# Patient Record
Sex: Male | Born: 1970
Health system: Southern US, Community
[De-identification: ages and names within clinical notes are randomized; demographics above are authoritative.]

## PROBLEM LIST (undated history)

## (undated) DIAGNOSIS — I10 Essential (primary) hypertension: Secondary | ICD-10-CM

## (undated) DIAGNOSIS — E785 Hyperlipidemia, unspecified: Secondary | ICD-10-CM

## (undated) DIAGNOSIS — M1990 Unspecified osteoarthritis, unspecified site: Secondary | ICD-10-CM

## (undated) HISTORY — PX: HERNIA REPAIR: SHX51

---

## 1997-11-08 ENCOUNTER — Emergency Department (HOSPITAL_COMMUNITY): Admission: EM | Admit: 1997-11-08 | Discharge: 1997-11-08 | Payer: Self-pay | Admitting: Emergency Medicine

## 1997-12-02 ENCOUNTER — Emergency Department (HOSPITAL_COMMUNITY): Admission: EM | Admit: 1997-12-02 | Discharge: 1997-12-02 | Payer: Self-pay | Admitting: Emergency Medicine

## 1997-12-23 ENCOUNTER — Encounter: Payer: Self-pay | Admitting: Emergency Medicine

## 1997-12-23 ENCOUNTER — Emergency Department (HOSPITAL_COMMUNITY): Admission: EM | Admit: 1997-12-23 | Discharge: 1997-12-23 | Payer: Self-pay | Admitting: Emergency Medicine

## 1998-07-12 ENCOUNTER — Encounter: Payer: Self-pay | Admitting: Emergency Medicine

## 1998-07-12 ENCOUNTER — Emergency Department (HOSPITAL_COMMUNITY): Admission: EM | Admit: 1998-07-12 | Discharge: 1998-07-12 | Payer: Self-pay | Admitting: Emergency Medicine

## 1998-12-02 ENCOUNTER — Emergency Department (HOSPITAL_COMMUNITY): Admission: EM | Admit: 1998-12-02 | Discharge: 1998-12-02 | Payer: Self-pay | Admitting: *Deleted

## 1999-05-31 ENCOUNTER — Emergency Department (HOSPITAL_COMMUNITY): Admission: EM | Admit: 1999-05-31 | Discharge: 1999-06-01 | Payer: Self-pay | Admitting: Emergency Medicine

## 1999-09-25 ENCOUNTER — Emergency Department (HOSPITAL_COMMUNITY): Admission: EM | Admit: 1999-09-25 | Discharge: 1999-09-25 | Payer: Self-pay | Admitting: Emergency Medicine

## 1999-10-18 ENCOUNTER — Emergency Department (HOSPITAL_COMMUNITY): Admission: EM | Admit: 1999-10-18 | Discharge: 1999-10-18 | Payer: Self-pay | Admitting: *Deleted

## 1999-10-23 ENCOUNTER — Emergency Department (HOSPITAL_COMMUNITY): Admission: EM | Admit: 1999-10-23 | Discharge: 1999-10-23 | Payer: Self-pay | Admitting: Internal Medicine

## 1999-10-23 ENCOUNTER — Encounter: Payer: Self-pay | Admitting: Internal Medicine

## 1999-11-24 ENCOUNTER — Emergency Department (HOSPITAL_COMMUNITY): Admission: EM | Admit: 1999-11-24 | Discharge: 1999-11-24 | Payer: Self-pay | Admitting: Emergency Medicine

## 2000-01-16 ENCOUNTER — Encounter: Payer: Self-pay | Admitting: Emergency Medicine

## 2000-01-16 ENCOUNTER — Emergency Department (HOSPITAL_COMMUNITY): Admission: EM | Admit: 2000-01-16 | Discharge: 2000-01-16 | Payer: Self-pay | Admitting: Emergency Medicine

## 2000-05-12 ENCOUNTER — Emergency Department (HOSPITAL_COMMUNITY): Admission: EM | Admit: 2000-05-12 | Discharge: 2000-05-12 | Payer: Self-pay | Admitting: Emergency Medicine

## 2000-05-12 ENCOUNTER — Encounter: Payer: Self-pay | Admitting: Emergency Medicine

## 2000-05-22 ENCOUNTER — Encounter: Admission: RE | Admit: 2000-05-22 | Discharge: 2000-05-22 | Payer: Self-pay

## 2000-05-24 ENCOUNTER — Ambulatory Visit (HOSPITAL_COMMUNITY): Admission: RE | Admit: 2000-05-24 | Discharge: 2000-05-24 | Payer: Self-pay

## 2000-06-12 ENCOUNTER — Emergency Department (HOSPITAL_COMMUNITY): Admission: EM | Admit: 2000-06-12 | Discharge: 2000-06-12 | Payer: Self-pay | Admitting: Emergency Medicine

## 2000-06-26 ENCOUNTER — Encounter: Payer: Self-pay | Admitting: Internal Medicine

## 2000-06-26 ENCOUNTER — Emergency Department (HOSPITAL_COMMUNITY): Admission: EM | Admit: 2000-06-26 | Discharge: 2000-06-26 | Payer: Self-pay | Admitting: Internal Medicine

## 2000-08-04 ENCOUNTER — Emergency Department (HOSPITAL_COMMUNITY): Admission: EM | Admit: 2000-08-04 | Discharge: 2000-08-04 | Payer: Self-pay | Admitting: Emergency Medicine

## 2001-02-25 ENCOUNTER — Emergency Department (HOSPITAL_COMMUNITY): Admission: EM | Admit: 2001-02-25 | Discharge: 2001-02-26 | Payer: Self-pay | Admitting: Emergency Medicine

## 2001-09-08 ENCOUNTER — Emergency Department (HOSPITAL_COMMUNITY): Admission: EM | Admit: 2001-09-08 | Discharge: 2001-09-09 | Payer: Self-pay

## 2001-10-17 ENCOUNTER — Emergency Department (HOSPITAL_COMMUNITY): Admission: EM | Admit: 2001-10-17 | Discharge: 2001-10-17 | Payer: Self-pay

## 2001-12-30 ENCOUNTER — Emergency Department (HOSPITAL_COMMUNITY): Admission: EM | Admit: 2001-12-30 | Discharge: 2001-12-30 | Payer: Self-pay | Admitting: Emergency Medicine

## 2002-04-22 ENCOUNTER — Encounter: Payer: Self-pay | Admitting: Emergency Medicine

## 2002-04-22 ENCOUNTER — Emergency Department (HOSPITAL_COMMUNITY): Admission: EM | Admit: 2002-04-22 | Discharge: 2002-04-22 | Payer: Self-pay | Admitting: Emergency Medicine

## 2002-08-06 ENCOUNTER — Encounter: Payer: Self-pay | Admitting: Emergency Medicine

## 2002-08-06 ENCOUNTER — Emergency Department (HOSPITAL_COMMUNITY): Admission: EM | Admit: 2002-08-06 | Discharge: 2002-08-06 | Payer: Self-pay | Admitting: Emergency Medicine

## 2002-08-24 ENCOUNTER — Emergency Department (HOSPITAL_COMMUNITY): Admission: EM | Admit: 2002-08-24 | Discharge: 2002-08-24 | Payer: Self-pay | Admitting: Emergency Medicine

## 2002-12-09 ENCOUNTER — Emergency Department (HOSPITAL_COMMUNITY): Admission: EM | Admit: 2002-12-09 | Discharge: 2002-12-09 | Payer: Self-pay | Admitting: Emergency Medicine

## 2003-01-28 ENCOUNTER — Emergency Department (HOSPITAL_COMMUNITY): Admission: EM | Admit: 2003-01-28 | Discharge: 2003-01-28 | Payer: Self-pay | Admitting: *Deleted

## 2003-06-01 ENCOUNTER — Emergency Department (HOSPITAL_COMMUNITY): Admission: EM | Admit: 2003-06-01 | Discharge: 2003-06-01 | Payer: Self-pay | Admitting: Emergency Medicine

## 2003-07-12 ENCOUNTER — Emergency Department (HOSPITAL_COMMUNITY): Admission: EM | Admit: 2003-07-12 | Discharge: 2003-07-12 | Payer: Self-pay | Admitting: Emergency Medicine

## 2004-01-16 ENCOUNTER — Emergency Department (HOSPITAL_COMMUNITY): Admission: EM | Admit: 2004-01-16 | Discharge: 2004-01-16 | Payer: Self-pay | Admitting: Emergency Medicine

## 2004-02-13 ENCOUNTER — Emergency Department (HOSPITAL_COMMUNITY): Admission: EM | Admit: 2004-02-13 | Discharge: 2004-02-13 | Payer: Self-pay | Admitting: Emergency Medicine

## 2004-07-02 ENCOUNTER — Emergency Department (HOSPITAL_COMMUNITY): Admission: EM | Admit: 2004-07-02 | Discharge: 2004-07-02 | Payer: Self-pay | Admitting: Emergency Medicine

## 2004-09-04 ENCOUNTER — Emergency Department (HOSPITAL_COMMUNITY): Admission: EM | Admit: 2004-09-04 | Discharge: 2004-09-05 | Payer: Self-pay | Admitting: Emergency Medicine

## 2004-12-02 ENCOUNTER — Ambulatory Visit (HOSPITAL_COMMUNITY): Admission: RE | Admit: 2004-12-02 | Discharge: 2004-12-02 | Payer: Self-pay | Admitting: Orthopaedic Surgery

## 2004-12-26 ENCOUNTER — Emergency Department (HOSPITAL_COMMUNITY): Admission: EM | Admit: 2004-12-26 | Discharge: 2004-12-27 | Payer: Self-pay | Admitting: Emergency Medicine

## 2005-04-25 ENCOUNTER — Emergency Department (HOSPITAL_COMMUNITY): Admission: EM | Admit: 2005-04-25 | Discharge: 2005-04-25 | Payer: Self-pay | Admitting: *Deleted

## 2005-07-25 ENCOUNTER — Inpatient Hospital Stay (HOSPITAL_COMMUNITY): Admission: EM | Admit: 2005-07-25 | Discharge: 2005-07-28 | Payer: Self-pay | Admitting: Emergency Medicine

## 2005-07-27 ENCOUNTER — Ambulatory Visit: Payer: Self-pay | Admitting: Cardiology

## 2005-09-03 ENCOUNTER — Emergency Department (HOSPITAL_COMMUNITY): Admission: EM | Admit: 2005-09-03 | Discharge: 2005-09-04 | Payer: Self-pay | Admitting: Emergency Medicine

## 2005-12-04 ENCOUNTER — Emergency Department (HOSPITAL_COMMUNITY): Admission: EM | Admit: 2005-12-04 | Discharge: 2005-12-04 | Payer: Self-pay | Admitting: Emergency Medicine

## 2006-01-28 ENCOUNTER — Emergency Department (HOSPITAL_COMMUNITY): Admission: EM | Admit: 2006-01-28 | Discharge: 2006-01-28 | Payer: Self-pay | Admitting: Emergency Medicine

## 2006-04-13 ENCOUNTER — Inpatient Hospital Stay (HOSPITAL_COMMUNITY): Admission: EM | Admit: 2006-04-13 | Discharge: 2006-04-17 | Payer: Self-pay | Admitting: Emergency Medicine

## 2006-04-13 ENCOUNTER — Emergency Department (HOSPITAL_COMMUNITY): Admission: EM | Admit: 2006-04-13 | Discharge: 2006-04-13 | Payer: Self-pay | Admitting: Emergency Medicine

## 2006-04-26 ENCOUNTER — Emergency Department (HOSPITAL_COMMUNITY): Admission: EM | Admit: 2006-04-26 | Discharge: 2006-04-26 | Payer: Self-pay | Admitting: Emergency Medicine

## 2006-05-28 ENCOUNTER — Emergency Department (HOSPITAL_COMMUNITY): Admission: EM | Admit: 2006-05-28 | Discharge: 2006-05-28 | Payer: Self-pay | Admitting: Family Medicine

## 2006-06-27 ENCOUNTER — Emergency Department (HOSPITAL_COMMUNITY): Admission: EM | Admit: 2006-06-27 | Discharge: 2006-06-27 | Payer: Self-pay | Admitting: Emergency Medicine

## 2006-07-10 ENCOUNTER — Emergency Department (HOSPITAL_COMMUNITY): Admission: EM | Admit: 2006-07-10 | Discharge: 2006-07-10 | Payer: Self-pay | Admitting: Family Medicine

## 2006-08-27 ENCOUNTER — Emergency Department (HOSPITAL_COMMUNITY): Admission: EM | Admit: 2006-08-27 | Discharge: 2006-08-27 | Payer: Self-pay | Admitting: Emergency Medicine

## 2006-11-09 ENCOUNTER — Emergency Department (HOSPITAL_COMMUNITY): Admission: EM | Admit: 2006-11-09 | Discharge: 2006-11-09 | Payer: Self-pay | Admitting: Emergency Medicine

## 2006-12-14 ENCOUNTER — Emergency Department (HOSPITAL_COMMUNITY): Admission: EM | Admit: 2006-12-14 | Discharge: 2006-12-15 | Payer: Self-pay | Admitting: Emergency Medicine

## 2006-12-17 ENCOUNTER — Emergency Department (HOSPITAL_COMMUNITY): Admission: EM | Admit: 2006-12-17 | Discharge: 2006-12-17 | Payer: Self-pay | Admitting: Emergency Medicine

## 2007-01-13 ENCOUNTER — Emergency Department (HOSPITAL_COMMUNITY): Admission: EM | Admit: 2007-01-13 | Discharge: 2007-01-13 | Payer: Self-pay | Admitting: Emergency Medicine

## 2007-05-20 IMAGING — US US ABDOMEN PORT
1 series · 14 of 25 positions shown · non-contrast
Comparison: none

CLINICAL DATA: Elevated LFTs

[Series 1: unknown · 0.43mm/px · 14 of 58 slices shown]
[im 1/58]
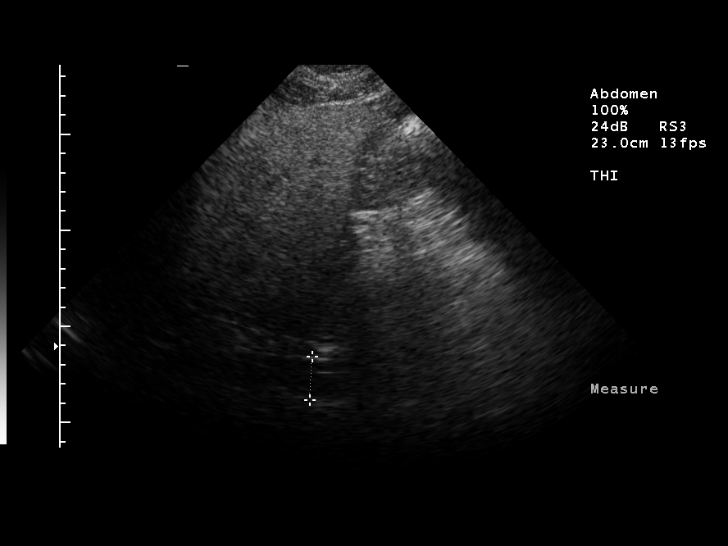
[im 5/58]
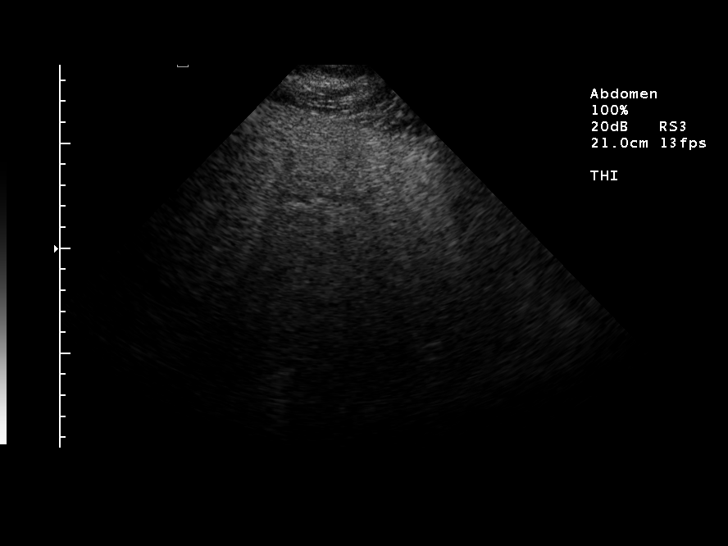
[im 10/58]
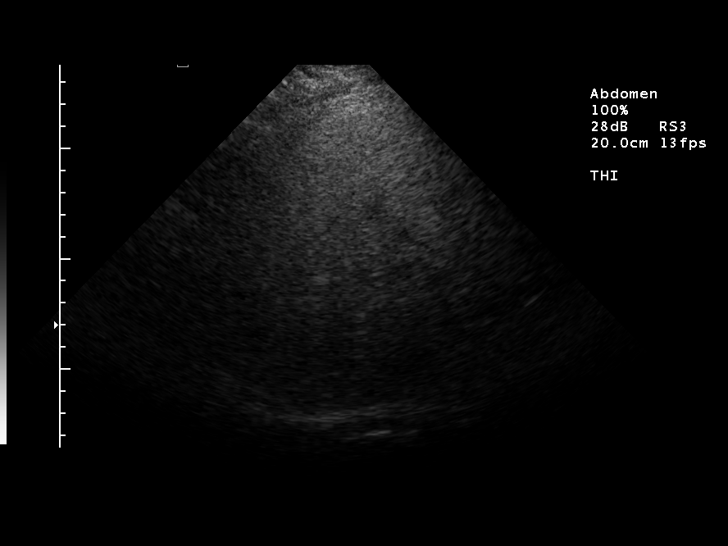
[im 15/58]
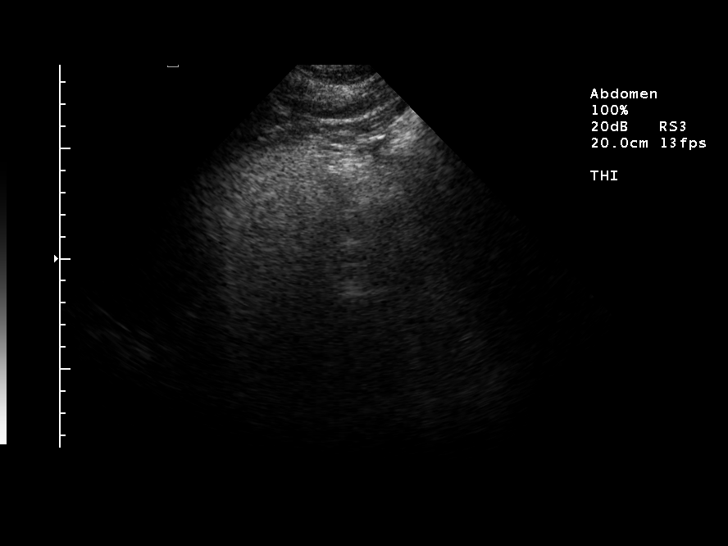
[im 20/58]
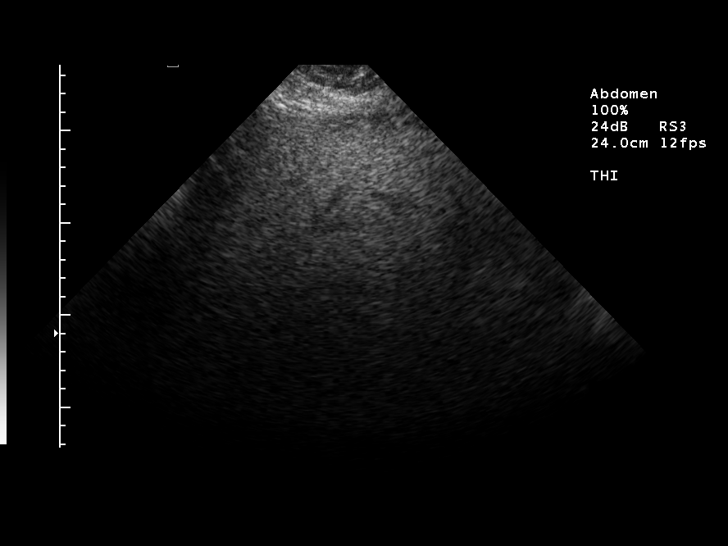
[im 22/58]
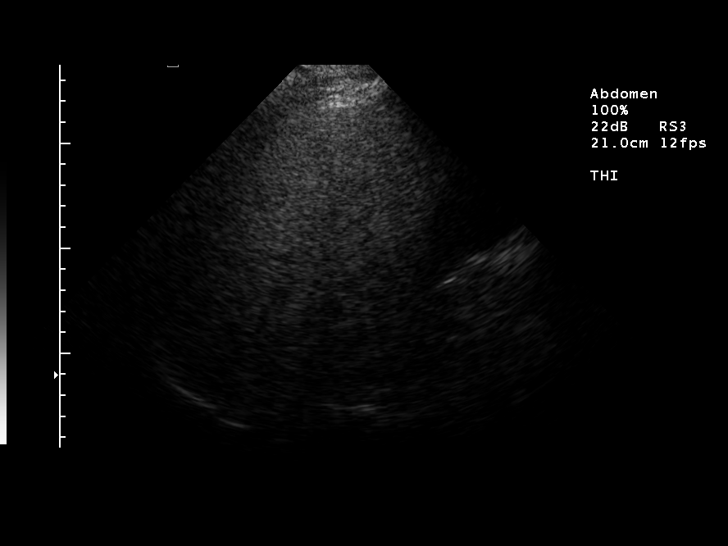
[im 27/58]
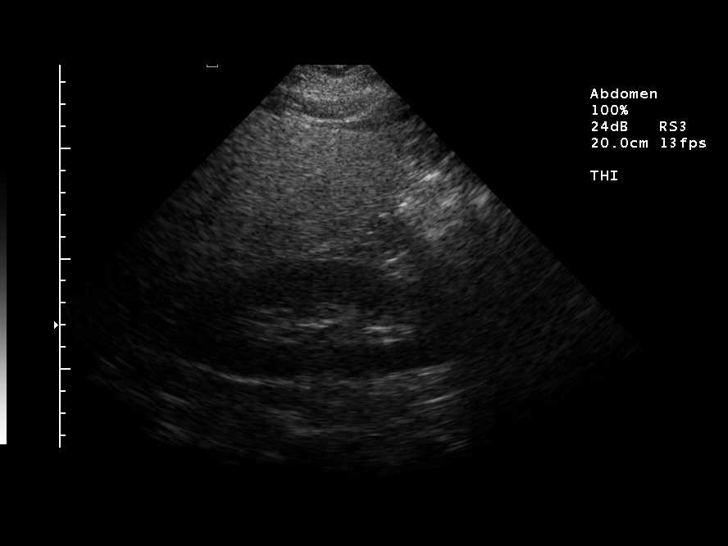
[im 31/58]
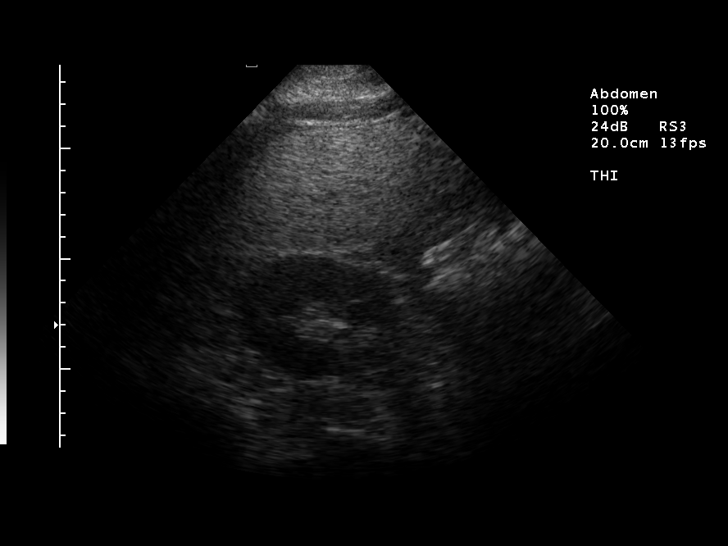
[im 36/58]
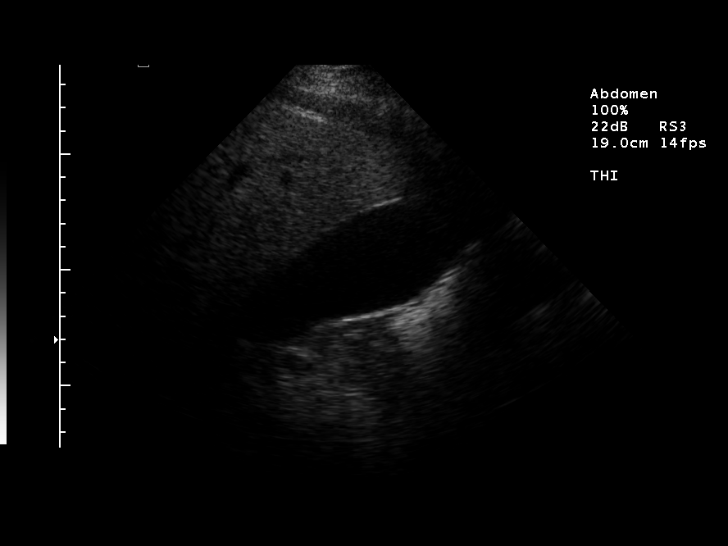
[im 39/58]
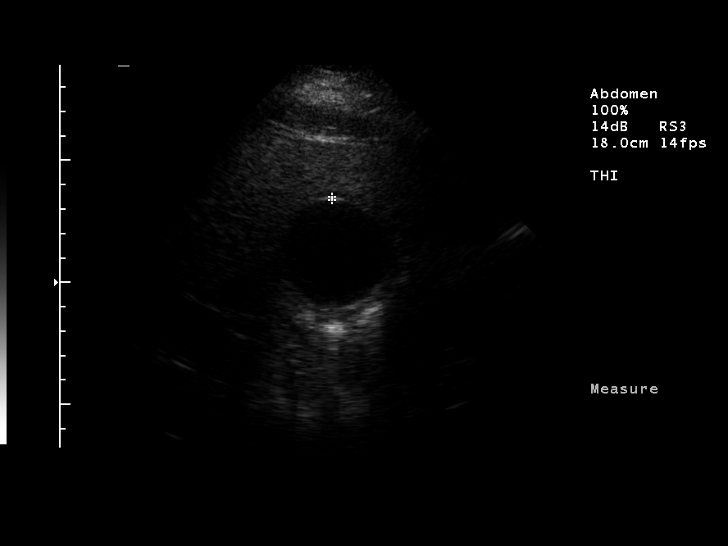
[im 43/58]
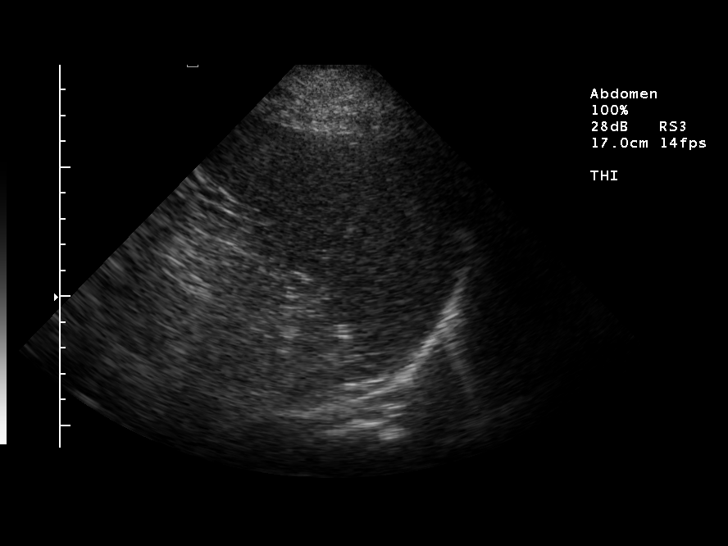
[im 48/58]
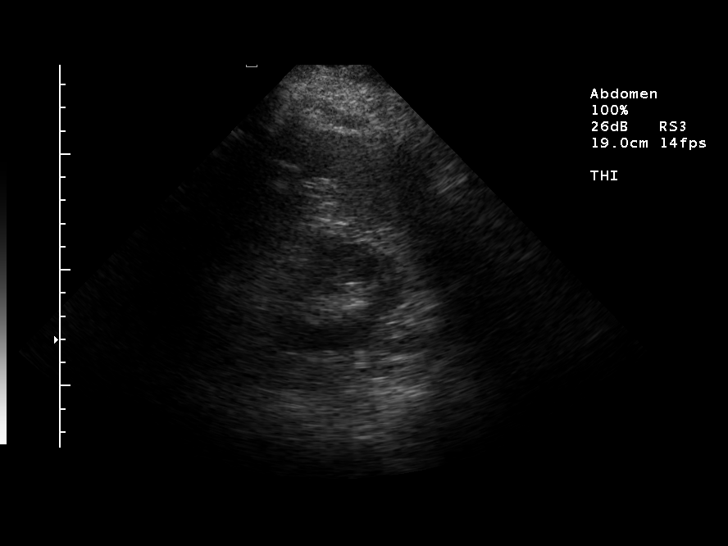
[im 53/58]
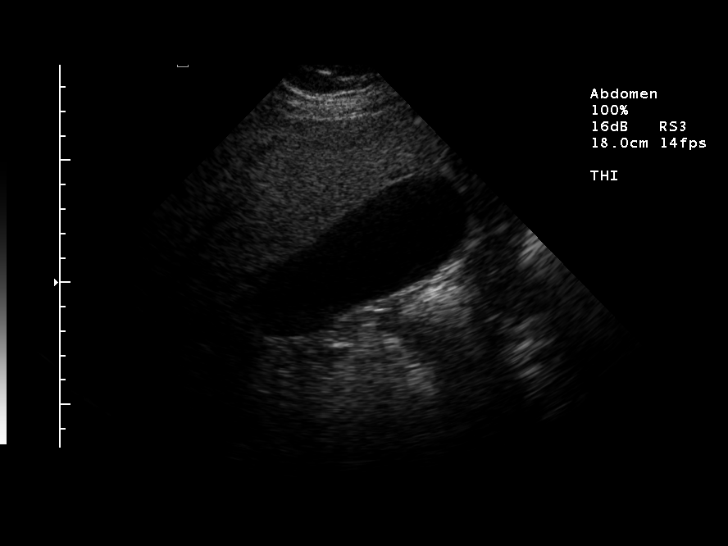
[im 58/58]
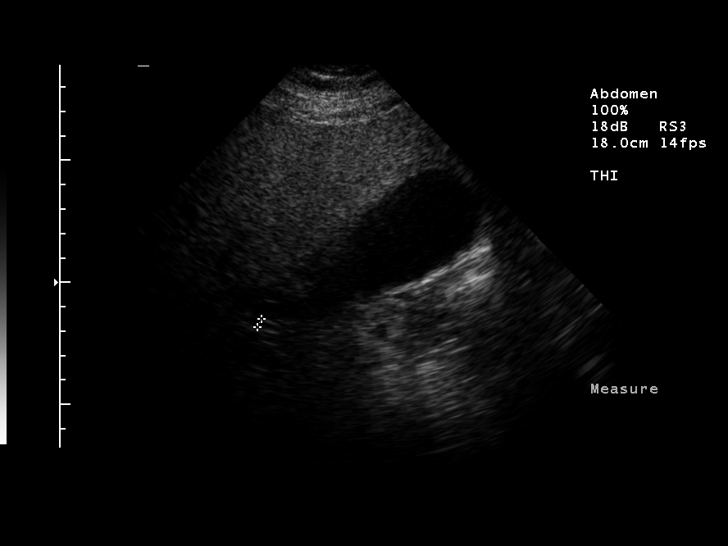

[14 of 25 positions shown; findings below may reference images not displayed]

Ultrasound abdomen portable:

Comparison 05/22/2000 by report only. Visualized portions of abdominal aorta,
IVC, and pancreas unremarkable, segments obscured by overlying bowel gas. The
liver is coarse and increased in echotexture without discrete lesion or
intrahepatic bile duct dilatation. The kidneys and spleen unremarkable.
Gallbladder physiologically distended without stones, wall thickening, or
pericholecystic fluid. Common bile duct normal in caliber, 3.7 mm diameter.
IMPRESSION: 1. Normal gallbladder.
2. Coarse echogenic liver parenchyma suggesting diffuse   fatty infiltration.

## 2007-05-29 ENCOUNTER — Emergency Department (HOSPITAL_COMMUNITY): Admission: EM | Admit: 2007-05-29 | Discharge: 2007-05-30 | Payer: Self-pay | Admitting: Emergency Medicine

## 2007-06-04 ENCOUNTER — Encounter: Admission: RE | Admit: 2007-06-04 | Discharge: 2007-06-04 | Payer: Self-pay | Admitting: General Surgery

## 2007-08-08 ENCOUNTER — Ambulatory Visit (HOSPITAL_COMMUNITY): Admission: RE | Admit: 2007-08-08 | Discharge: 2007-08-09 | Payer: Self-pay | Admitting: General Surgery

## 2007-08-16 ENCOUNTER — Emergency Department (HOSPITAL_COMMUNITY): Admission: EM | Admit: 2007-08-16 | Discharge: 2007-08-16 | Payer: Self-pay | Admitting: Emergency Medicine

## 2008-01-12 ENCOUNTER — Emergency Department (HOSPITAL_COMMUNITY): Admission: EM | Admit: 2008-01-12 | Discharge: 2008-01-12 | Payer: Self-pay | Admitting: Emergency Medicine

## 2008-01-20 IMAGING — CR DG CHEST 2V
2 series · 2 of 2 positions shown · non-contrast
Comparison: 06/27/2006

CLINICAL DATA: Chest pain radiating to the left arm.   Hypertension, diabetes.  
 CHEST - 2 VIEW:

[w chest pa *]
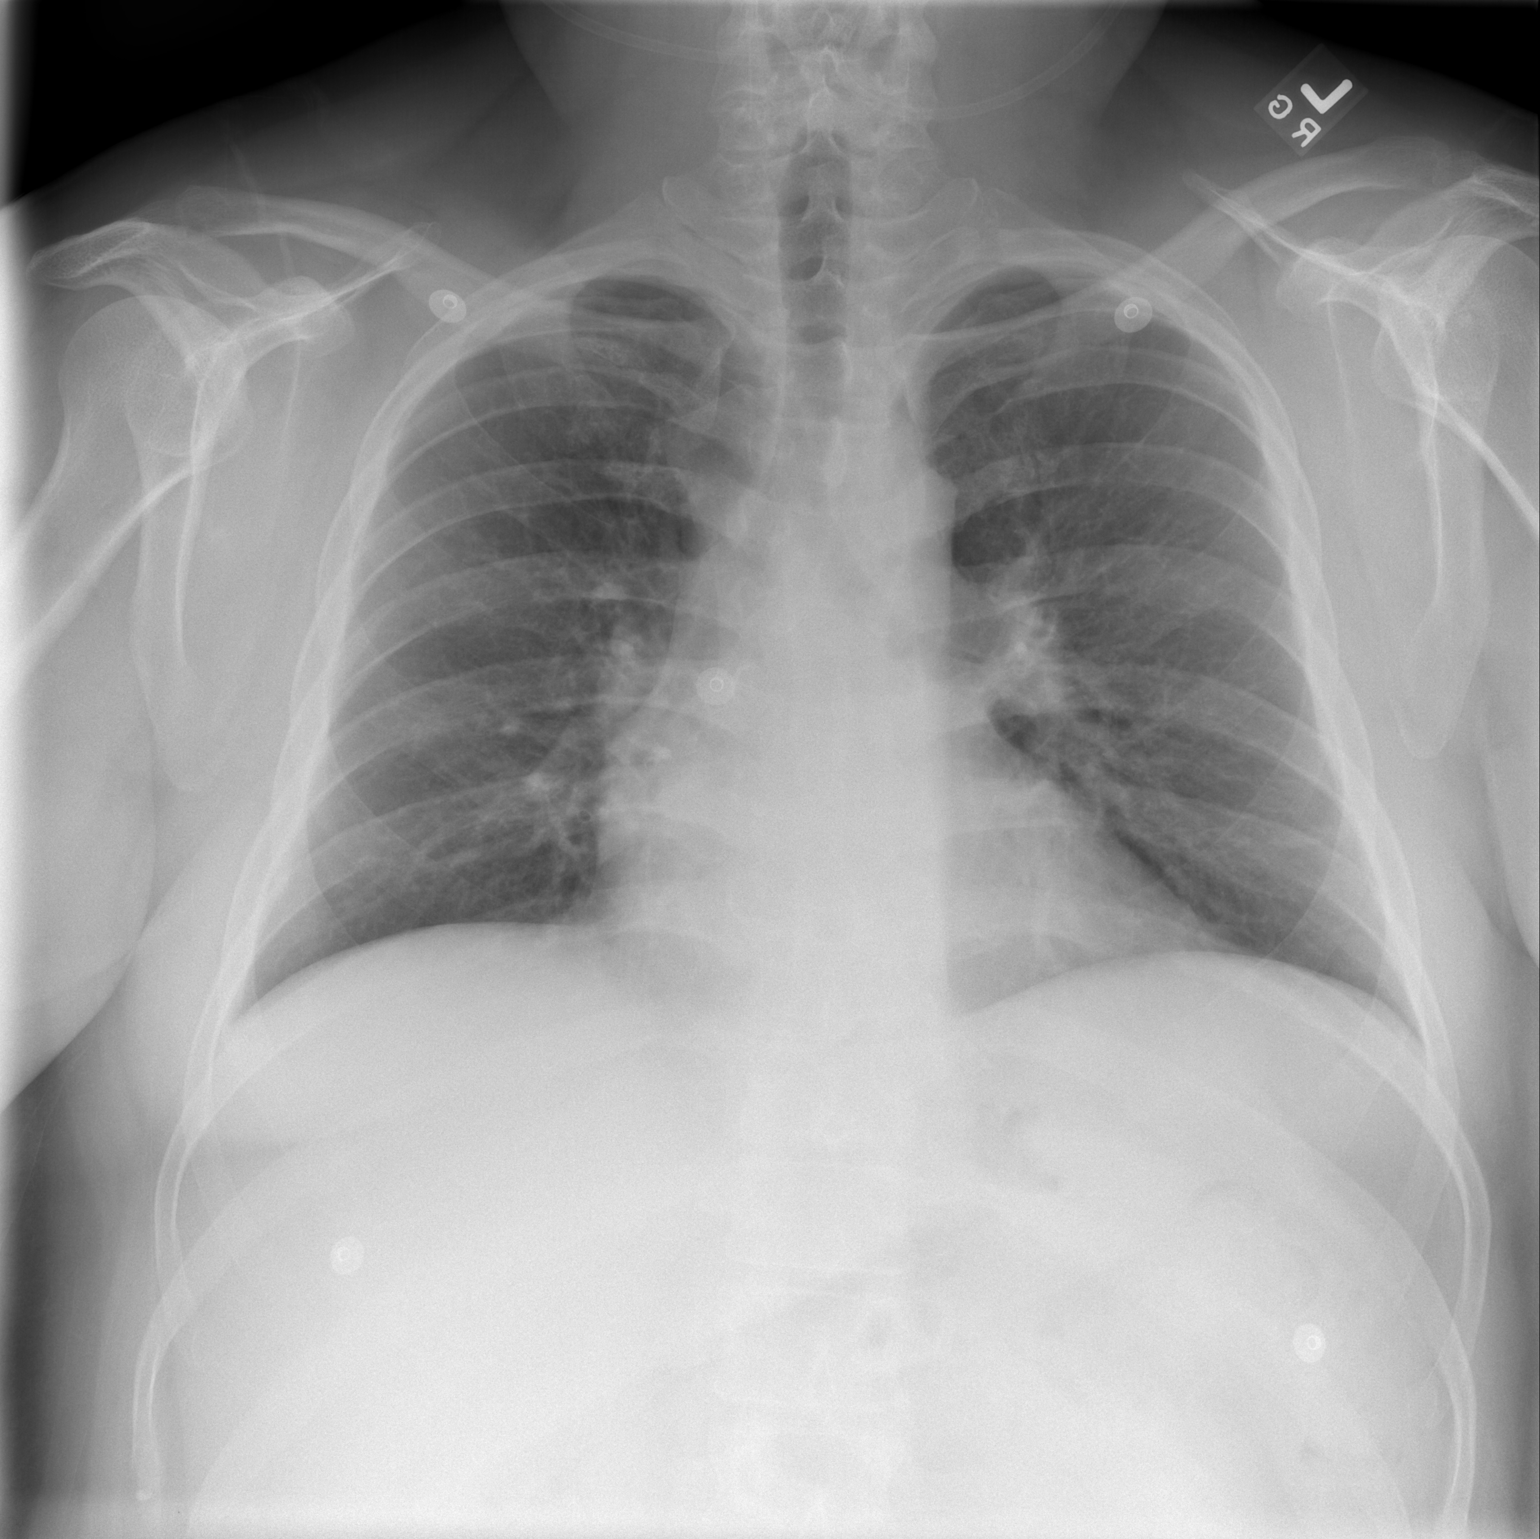

[w chest lat *]
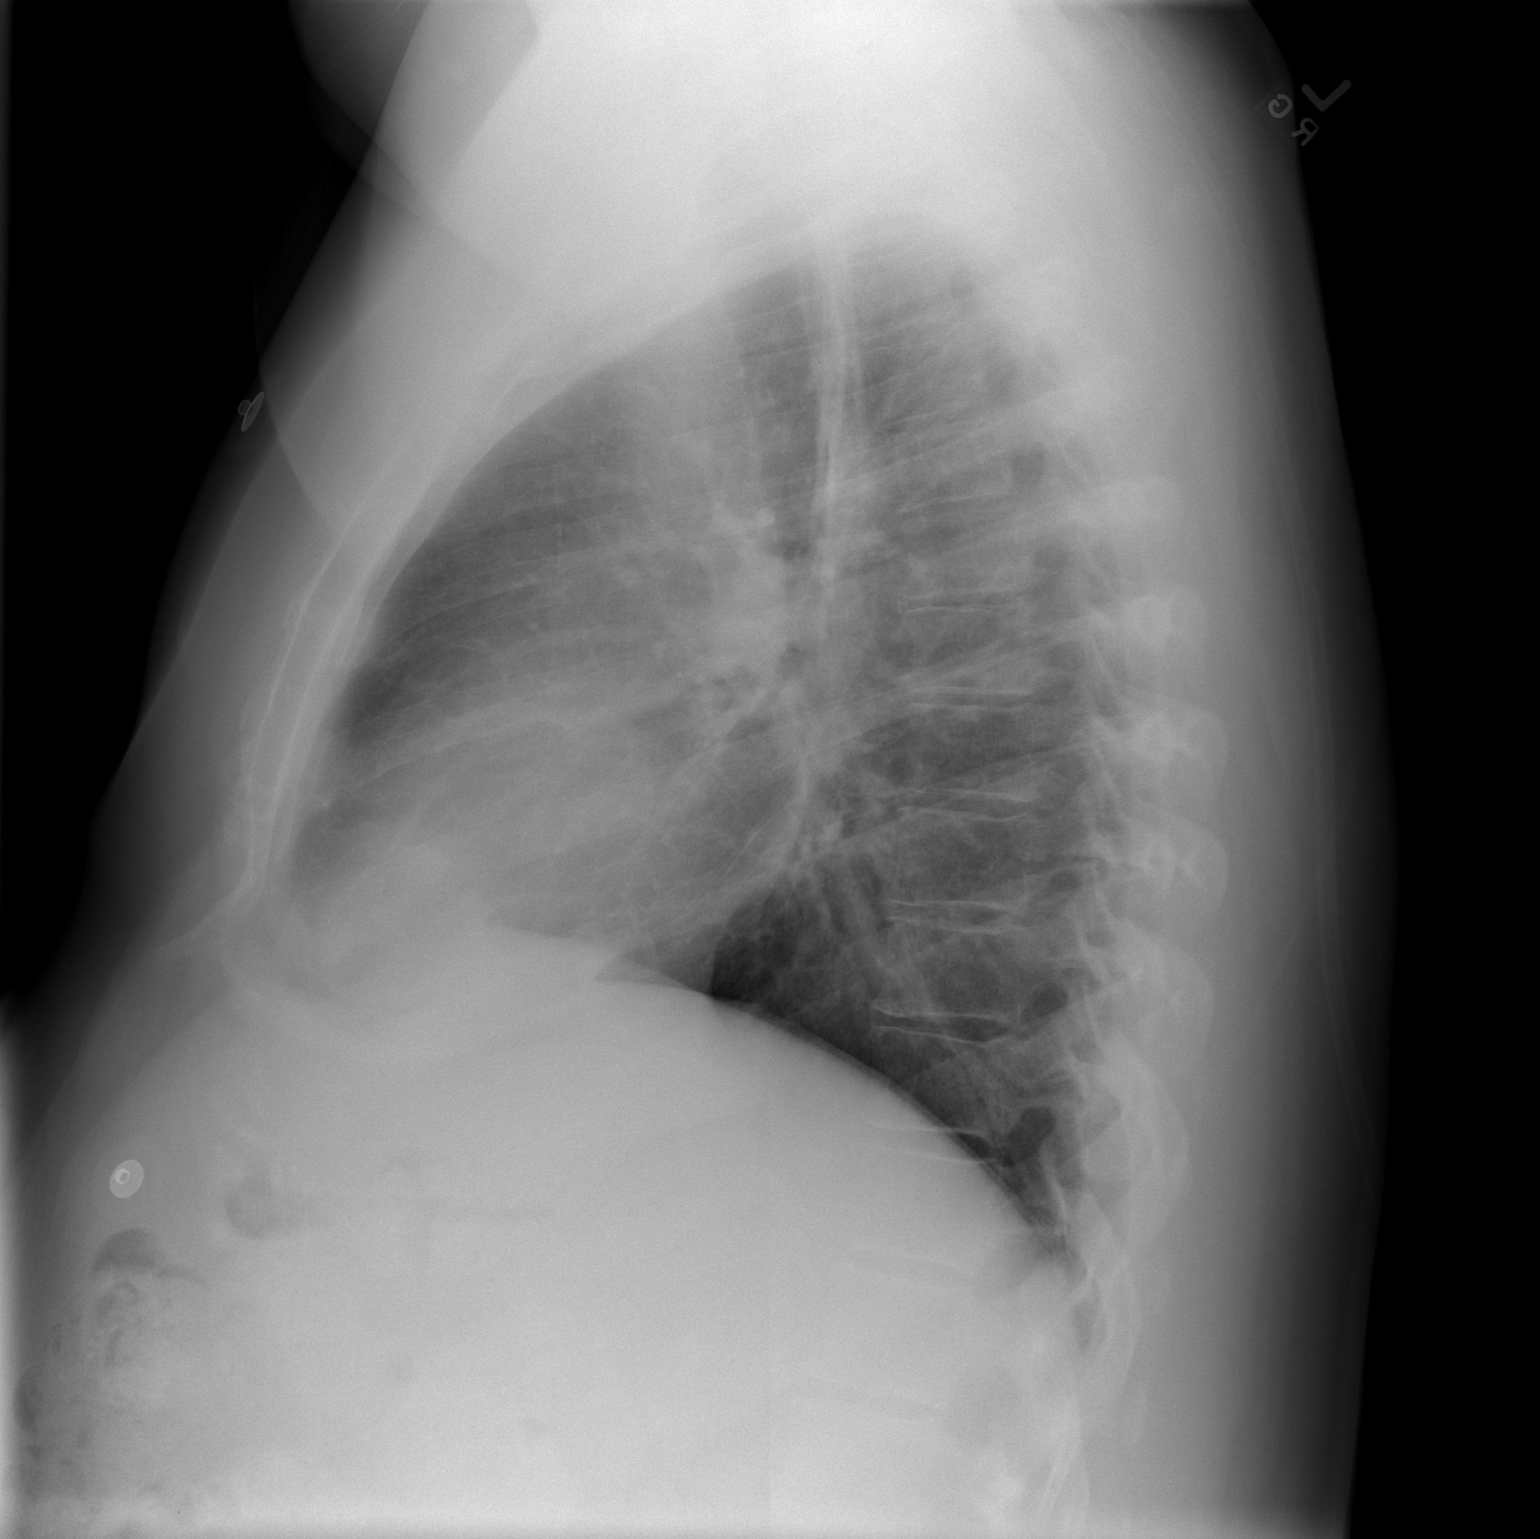

[2 of 2 positions shown; findings below may reference images not displayed]

FINDINGS: The heart size and mediastinal contours are within normal limits.  Both lungs are clear.  The visualized skeletal structures are unremarkable.
IMPRESSION: No active cardiopulmonary disease.

## 2008-01-31 ENCOUNTER — Encounter: Payer: Self-pay | Admitting: Orthopedic Surgery

## 2008-01-31 ENCOUNTER — Emergency Department (HOSPITAL_COMMUNITY): Admission: EM | Admit: 2008-01-31 | Discharge: 2008-01-31 | Payer: Self-pay | Admitting: Emergency Medicine

## 2008-02-12 ENCOUNTER — Encounter: Payer: Self-pay | Admitting: Orthopedic Surgery

## 2008-02-18 ENCOUNTER — Ambulatory Visit: Payer: Self-pay | Admitting: Orthopedic Surgery

## 2008-02-18 DIAGNOSIS — M25469 Effusion, unspecified knee: Secondary | ICD-10-CM | POA: Insufficient documentation

## 2008-02-18 DIAGNOSIS — IMO0002 Reserved for concepts with insufficient information to code with codable children: Secondary | ICD-10-CM | POA: Insufficient documentation

## 2008-02-18 IMAGING — CT CT HEAD W/O CM
1 series · 15 of 28 positions shown, 19 images · non-contrast
Comparison: None

CLINICAL DATA: Headache for one week. Hypertension.

HEAD CT WITHOUT CONTRAST
TECHNIQUE: 5mm collimated images were obtained from the base of the skull
through the vertex according to standard protocol without contrast.

[Series 2: brain · axial · 0.47mm/px · z∈[+159,+294]mm · 15 of 28 slices shown, 19 images]
[im 2/28  brain]
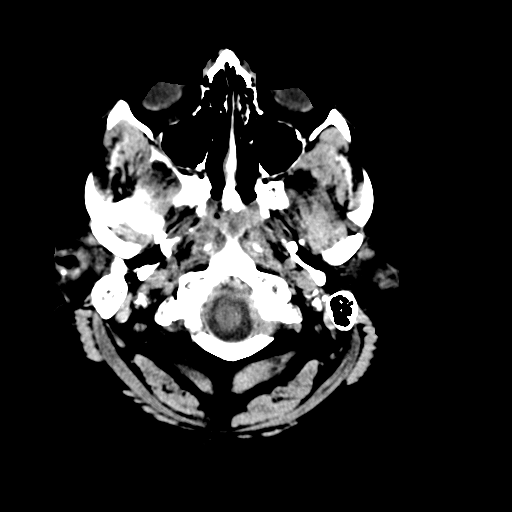
[im 2/28  bone]
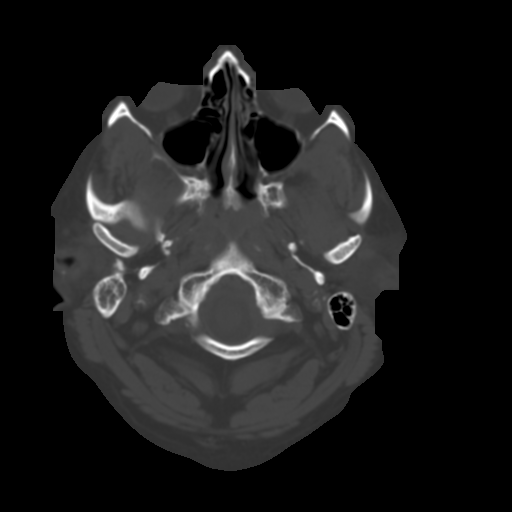
[im 4/28  brain]
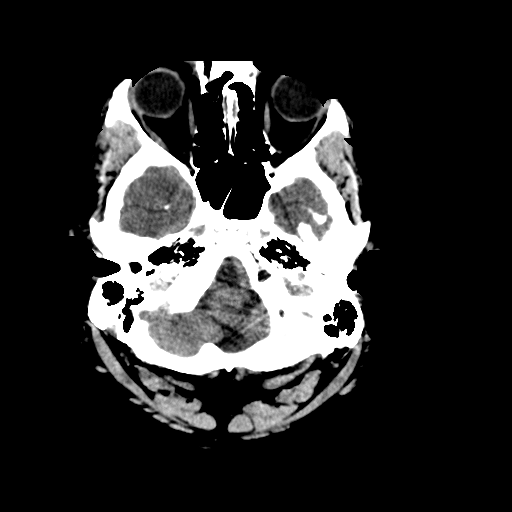
[im 6/28  brain]
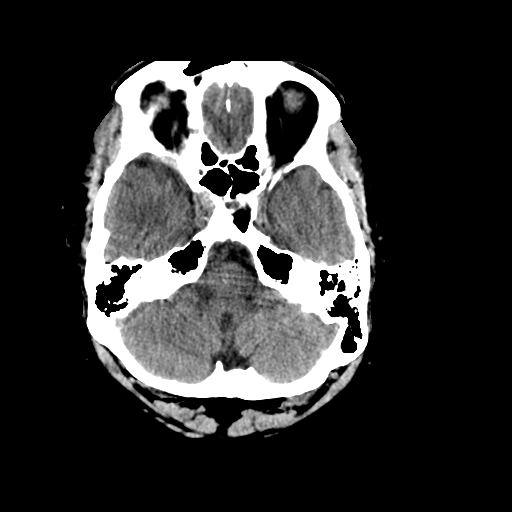
[im 8/28  brain]
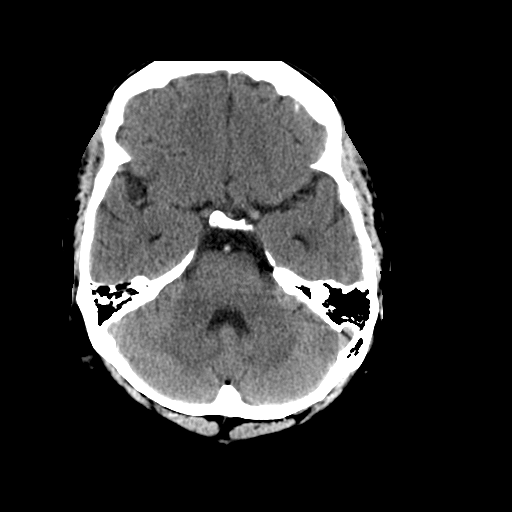
[im 9/28  brain]
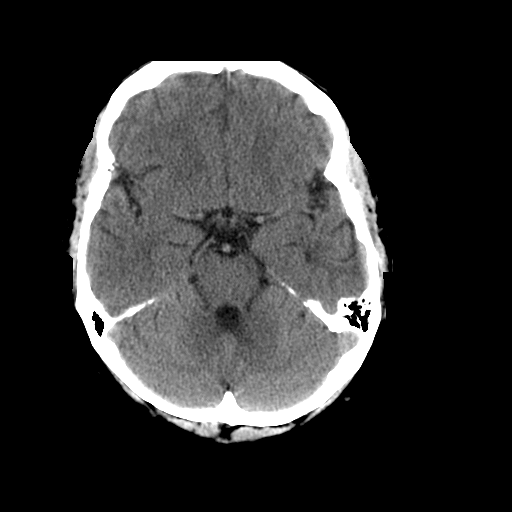
[im 9/28  bone]
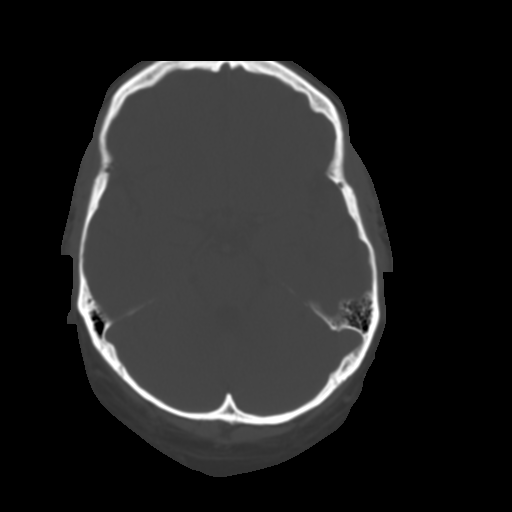
[im 11/28  brain]
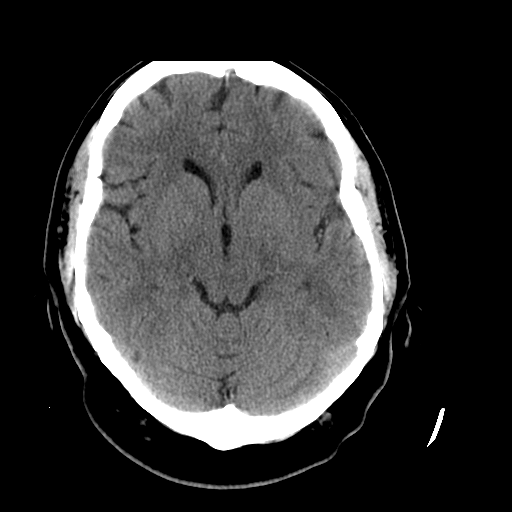
[im 13/28  brain]
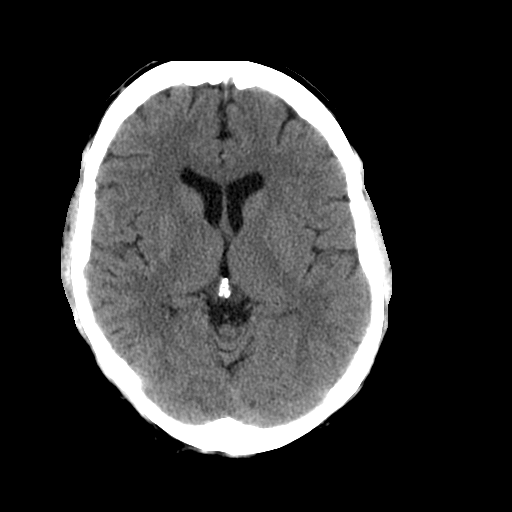
[im 15/28  brain]
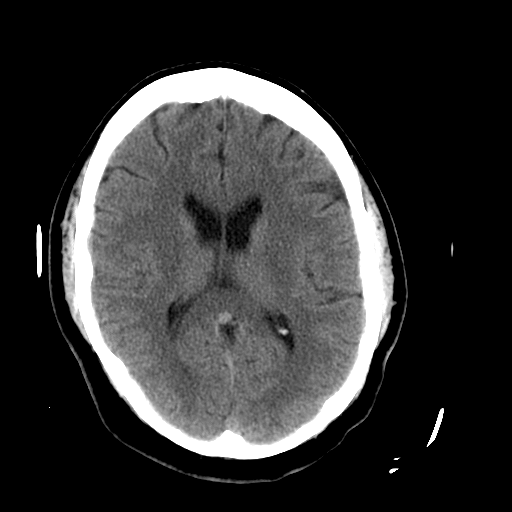
[im 16/28  brain]
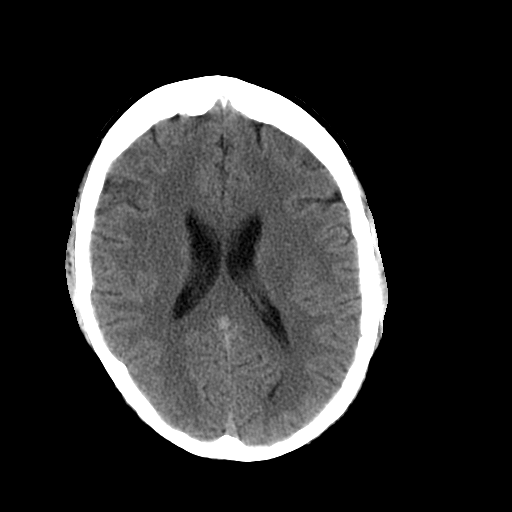
[im 16/28  bone]
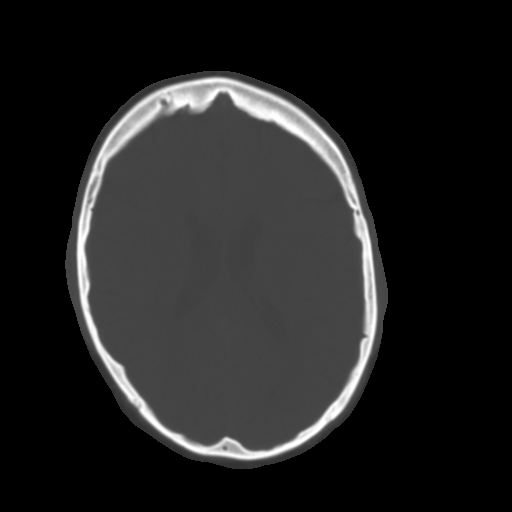
[im 18/28  brain]
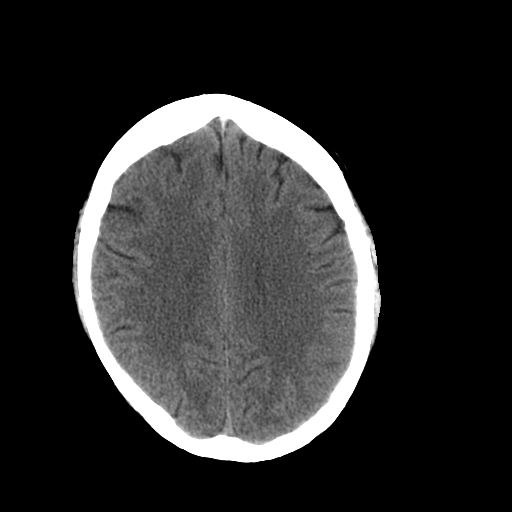
[im 20/28  brain]
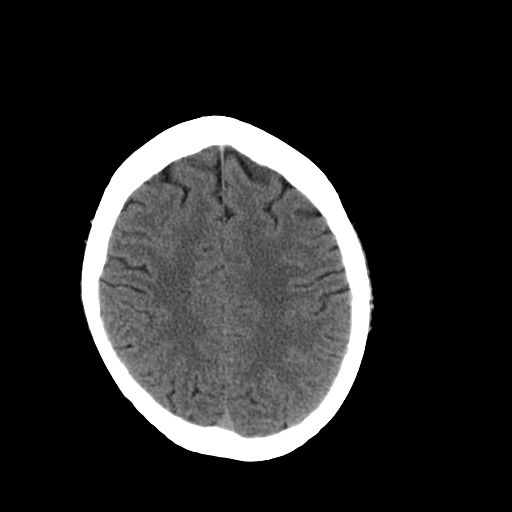
[im 21/28  brain]
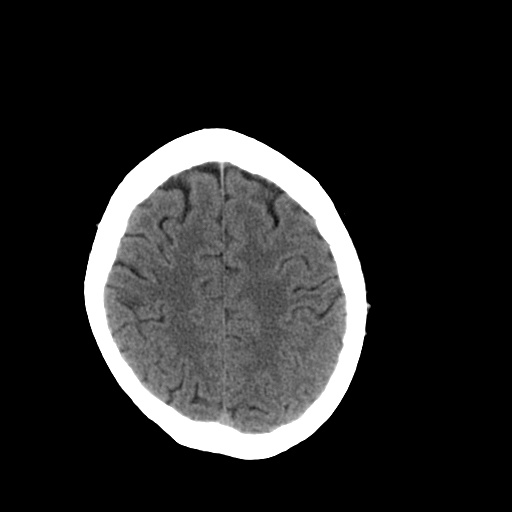
[im 23/28  brain]
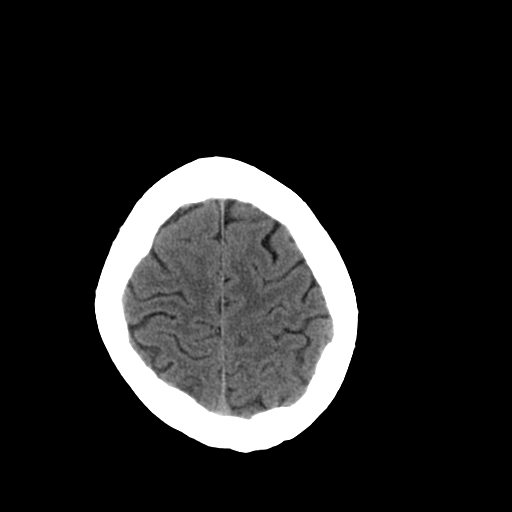
[im 23/28  bone]
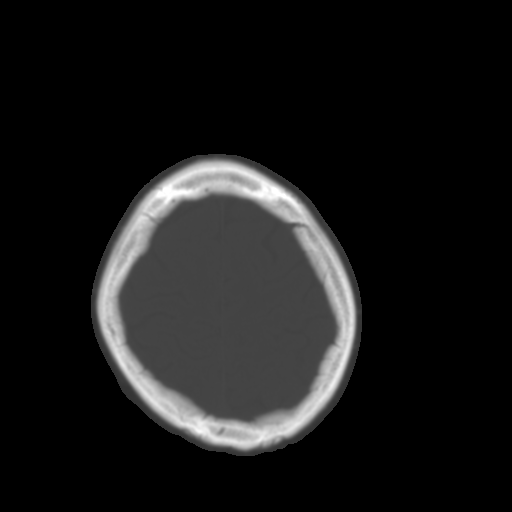
[im 25/28  brain]
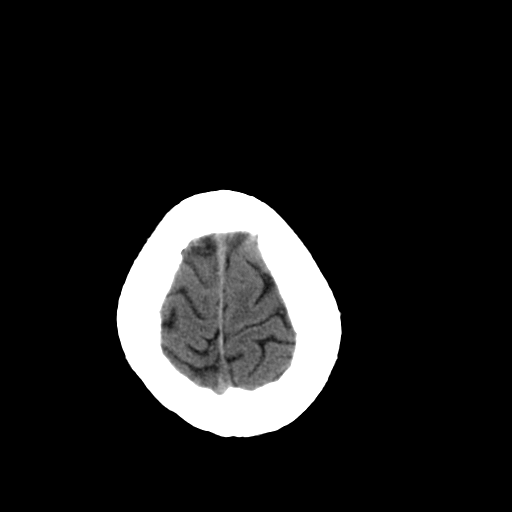
[im 27/28  brain]
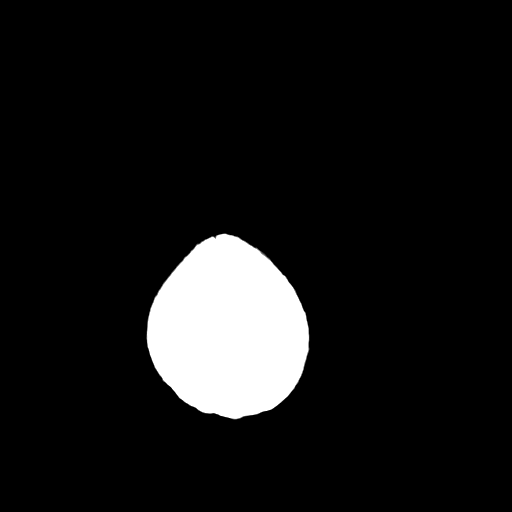

[15 of 28 positions shown; findings below may reference images not displayed]

FINDINGS: The brainstem, cerebellum, thalami, basal ganglia, basilar cisterns,
and ventricular system appear unremarkable. On image 8 series 2 there is focal
density within or along the basilar artery, which could simply be
atherosclerotic and is less likely to represent basilar artery thrombosis. If
the patient has cerebellar signs on the physical exam than MRI with MR
angiography may be warranted.

No intracranial hemorrhage, mass lesion, or acute CVA is identified. No
significant abnormal intra-axial fluid collection is identified.

IMPRESSION

1. No acute intracranial findings.
2. Density in the basilar artery likely represents volume averaging of
atherosclerotic calcification and less likely vascular thrombosis. If the
patient has cerebellar signs on physical exam, then MRI with MR angiogram should
be considered.

## 2008-03-23 ENCOUNTER — Telehealth: Payer: Self-pay | Admitting: Orthopedic Surgery

## 2008-04-01 ENCOUNTER — Emergency Department (HOSPITAL_COMMUNITY): Admission: EM | Admit: 2008-04-01 | Discharge: 2008-04-02 | Payer: Self-pay | Admitting: Emergency Medicine

## 2008-04-02 ENCOUNTER — Encounter: Payer: Self-pay | Admitting: Orthopedic Surgery

## 2008-04-10 ENCOUNTER — Inpatient Hospital Stay (HOSPITAL_COMMUNITY): Admission: EM | Admit: 2008-04-10 | Discharge: 2008-04-12 | Payer: Self-pay | Admitting: Emergency Medicine

## 2008-04-11 ENCOUNTER — Ambulatory Visit: Payer: Self-pay | Admitting: Cardiovascular Disease

## 2008-06-22 ENCOUNTER — Ambulatory Visit (HOSPITAL_COMMUNITY): Admission: RE | Admit: 2008-06-22 | Discharge: 2008-06-22 | Payer: Self-pay | Admitting: Internal Medicine

## 2009-03-04 ENCOUNTER — Emergency Department (HOSPITAL_COMMUNITY): Admission: EM | Admit: 2009-03-04 | Discharge: 2009-03-04 | Payer: Self-pay | Admitting: Emergency Medicine

## 2009-07-12 ENCOUNTER — Emergency Department (HOSPITAL_COMMUNITY): Admission: EM | Admit: 2009-07-12 | Discharge: 2009-07-13 | Payer: Self-pay | Admitting: Emergency Medicine

## 2009-07-17 ENCOUNTER — Emergency Department (HOSPITAL_COMMUNITY): Admission: EM | Admit: 2009-07-17 | Discharge: 2009-07-17 | Payer: Self-pay | Admitting: Emergency Medicine

## 2009-07-24 ENCOUNTER — Emergency Department (HOSPITAL_COMMUNITY): Admission: EM | Admit: 2009-07-24 | Discharge: 2009-07-24 | Payer: Self-pay | Admitting: Emergency Medicine

## 2009-08-09 ENCOUNTER — Emergency Department (HOSPITAL_COMMUNITY): Admission: EM | Admit: 2009-08-09 | Discharge: 2009-08-10 | Payer: Self-pay | Admitting: Emergency Medicine

## 2009-11-30 ENCOUNTER — Emergency Department (HOSPITAL_COMMUNITY): Admission: EM | Admit: 2009-11-30 | Discharge: 2009-11-30 | Payer: Self-pay | Admitting: Emergency Medicine

## 2009-12-19 ENCOUNTER — Emergency Department (HOSPITAL_COMMUNITY): Admission: EM | Admit: 2009-12-19 | Discharge: 2009-12-19 | Payer: Self-pay | Admitting: Emergency Medicine

## 2010-02-07 ENCOUNTER — Emergency Department (HOSPITAL_COMMUNITY)
Admission: EM | Admit: 2010-02-07 | Discharge: 2010-02-07 | Payer: Self-pay | Source: Home / Self Care | Admitting: Emergency Medicine

## 2010-04-01 ENCOUNTER — Encounter: Payer: Self-pay | Admitting: Internal Medicine

## 2010-04-02 ENCOUNTER — Encounter: Payer: Self-pay | Admitting: Cardiology

## 2010-04-16 ENCOUNTER — Emergency Department (HOSPITAL_COMMUNITY)
Admission: EM | Admit: 2010-04-16 | Discharge: 2010-04-16 | Disposition: A | Discharge status: Home / Self Care | Payer: Self-pay | Attending: Emergency Medicine | Admitting: Emergency Medicine

## 2010-04-16 DIAGNOSIS — I10 Essential (primary) hypertension: Secondary | ICD-10-CM | POA: Insufficient documentation

## 2010-04-16 DIAGNOSIS — E7800 Pure hypercholesterolemia, unspecified: Secondary | ICD-10-CM | POA: Insufficient documentation

## 2010-04-16 DIAGNOSIS — E119 Type 2 diabetes mellitus without complications: Secondary | ICD-10-CM | POA: Insufficient documentation

## 2010-04-16 DIAGNOSIS — Z79899 Other long term (current) drug therapy: Secondary | ICD-10-CM | POA: Insufficient documentation

## 2010-04-16 DIAGNOSIS — R109 Unspecified abdominal pain: Secondary | ICD-10-CM | POA: Insufficient documentation

## 2010-05-25 LAB — URINALYSIS, ROUTINE W REFLEX MICROSCOPIC
Bilirubin Urine: NEGATIVE
Glucose, UA: NEGATIVE mg/dL
Ketones, ur: NEGATIVE mg/dL
pH: 5.5 (ref 5.0–8.0)

## 2010-05-29 ENCOUNTER — Emergency Department (HOSPITAL_COMMUNITY): Payer: Self-pay

## 2010-05-29 ENCOUNTER — Emergency Department (HOSPITAL_COMMUNITY)
Admission: EM | Admit: 2010-05-29 | Discharge: 2010-05-30 | Disposition: A | Discharge status: Home / Self Care | Payer: Self-pay | Attending: Emergency Medicine | Admitting: Emergency Medicine

## 2010-05-29 DIAGNOSIS — I10 Essential (primary) hypertension: Secondary | ICD-10-CM | POA: Insufficient documentation

## 2010-05-29 DIAGNOSIS — Z79899 Other long term (current) drug therapy: Secondary | ICD-10-CM | POA: Insufficient documentation

## 2010-05-29 DIAGNOSIS — R079 Chest pain, unspecified: Secondary | ICD-10-CM | POA: Insufficient documentation

## 2010-05-29 DIAGNOSIS — E7800 Pure hypercholesterolemia, unspecified: Secondary | ICD-10-CM | POA: Insufficient documentation

## 2010-05-29 DIAGNOSIS — IMO0001 Reserved for inherently not codable concepts without codable children: Secondary | ICD-10-CM | POA: Insufficient documentation

## 2010-05-29 DIAGNOSIS — E119 Type 2 diabetes mellitus without complications: Secondary | ICD-10-CM | POA: Insufficient documentation

## 2010-05-29 DIAGNOSIS — R110 Nausea: Secondary | ICD-10-CM | POA: Insufficient documentation

## 2010-05-29 LAB — DIFFERENTIAL
Basophils Absolute: 0.2 10*3/uL — ABNORMAL HIGH (ref 0.0–0.1)
Basophils Relative: 3 % — ABNORMAL HIGH (ref 0–1)
Basophils Relative: 1 % (ref 0–1)
Eosinophils Relative: 5 % (ref 0–5)
Monocytes Absolute: 0.7 10*3/uL (ref 0.1–1.0)
Monocytes Absolute: 0.6 10*3/uL (ref 0.1–1.0)
Monocytes Relative: 10 % (ref 3–12)
Neutro Abs: 2.9 10*3/uL (ref 1.7–7.7)

## 2010-05-29 LAB — BASIC METABOLIC PANEL
CO2: 29 mEq/L (ref 19–32)
CO2: 27 mEq/L (ref 19–32)
Chloride: 109 mEq/L (ref 96–112)
GFR calc Af Amer: >60 mL/min (ref >60)
Glucose, Bld: 127 mg/dL — ABNORMAL HIGH (ref 70–99)
Potassium: 4.2 mEq/L (ref 3.5–5.1)
Potassium: 4.3 mEq/L (ref 3.5–5.1)
Sodium: 138 mEq/L (ref 135–145)
Sodium: 143 mEq/L (ref 135–145)

## 2010-05-29 LAB — POCT CARDIAC MARKERS

## 2010-05-29 LAB — CBC
HCT: 40.7 % (ref 39.0–52.0)
HCT: 41.7 % (ref 39.0–52.0)
Hemoglobin: 13.5 g/dL (ref 13.0–17.0)
Hemoglobin: 13.8 g/dL (ref 13.0–17.0)
MCH: 27.2 pg (ref 26.0–34.0)
MCHC: 33.2 g/dL (ref 30.0–36.0)
MCHC: 33.1 g/dL (ref 30.0–36.0)
MCV: 81.9 fL (ref 78.0–100.0)
MCV: 82.2 fL (ref 78.0–100.0)
RDW: 14.2 % (ref 11.5–15.5)

## 2010-05-29 LAB — URINALYSIS, ROUTINE W REFLEX MICROSCOPIC
Bilirubin Urine: NEGATIVE
Hgb urine dipstick: NEGATIVE
Protein, ur: NEGATIVE mg/dL
Specific Gravity, Urine: >1.03 — ABNORMAL HIGH (ref 1.005–1.030)
Urobilinogen, UA: 0.2 mg/dL (ref 0.0–1.0)

## 2010-05-30 LAB — CBC
HCT: 39.6 % (ref 39.0–52.0)
MCV: 80.7 fL (ref 78.0–100.0)
RBC: 4.91 MIL/uL (ref 4.22–5.81)
WBC: 5.4 10*3/uL (ref 4.0–10.5)

## 2010-05-30 LAB — BASIC METABOLIC PANEL
Chloride: 100 mEq/L (ref 96–112)
GFR calc Af Amer: >60 mL/min (ref >60)
GFR calc non Af Amer: >60 mL/min (ref >60)
Potassium: 3.6 mEq/L (ref 3.5–5.1)
Sodium: 134 mEq/L — ABNORMAL LOW (ref 135–145)

## 2010-05-30 LAB — DIFFERENTIAL
Eosinophils Absolute: 0.3 10*3/uL (ref 0.0–0.7)
Eosinophils Relative: 6 % — ABNORMAL HIGH (ref 0–5)
Lymphocytes Relative: 29 % (ref 12–46)
Lymphs Abs: 1.6 10*3/uL (ref 0.7–4.0)
Monocytes Relative: 17 % — ABNORMAL HIGH (ref 3–12)

## 2010-05-30 LAB — RAPID STREP SCREEN (MED CTR MEBANE ONLY)

## 2010-06-26 LAB — BASIC METABOLIC PANEL
BUN: 15 mg/dL (ref 6–23)
BUN: 20 mg/dL (ref 6–23)
CO2: 26 mEq/L (ref 19–32)
Chloride: 103 mEq/L (ref 96–112)
Chloride: 104 mEq/L (ref 96–112)
Creatinine, Ser: 0.88 mg/dL (ref 0.4–1.5)
GFR calc Af Amer: >60 mL/min (ref >60)
GFR calc non Af Amer: >60 mL/min (ref >60)
GFR calc non Af Amer: >60 mL/min (ref >60)
Glucose, Bld: 121 mg/dL — ABNORMAL HIGH (ref 70–99)
Potassium: 4 mEq/L (ref 3.5–5.1)
Potassium: 4.6 mEq/L (ref 3.5–5.1)
Sodium: 135 mEq/L (ref 135–145)

## 2010-06-26 LAB — DIFFERENTIAL
Basophils Absolute: 0 10*3/uL (ref 0.0–0.1)
Eosinophils Absolute: 0.2 10*3/uL (ref 0.0–0.7)
Lymphocytes Relative: 28 % (ref 12–46)
Lymphocytes Relative: 34 % (ref 12–46)
Lymphs Abs: 2.5 10*3/uL (ref 0.7–4.0)
Monocytes Relative: 6 % (ref 3–12)
Neutro Abs: 2.8 10*3/uL (ref 1.7–7.7)
Neutro Abs: 5.6 10*3/uL (ref 1.7–7.7)
Neutrophils Relative %: 64 % (ref 43–77)

## 2010-06-26 LAB — GLUCOSE, CAPILLARY
Glucose-Capillary: 173 mg/dL — ABNORMAL HIGH (ref 70–99)
Glucose-Capillary: 120 mg/dL — ABNORMAL HIGH (ref 70–99)
Glucose-Capillary: 128 mg/dL — ABNORMAL HIGH (ref 70–99)
Glucose-Capillary: 198 mg/dL — ABNORMAL HIGH (ref 70–99)

## 2010-06-26 LAB — CBC
HCT: 40.4 % (ref 39.0–52.0)
Hemoglobin: 13.8 g/dL (ref 13.0–17.0)
MCV: 83.4 fL (ref 78.0–100.0)
MCV: 83.6 fL (ref 78.0–100.0)
Platelets: 294 10*3/uL (ref 150–400)
Platelets: 338 10*3/uL (ref 150–400)
RBC: 4.94 MIL/uL (ref 4.22–5.81)
RDW: 14.6 % (ref 11.5–15.5)
WBC: 8.8 10*3/uL (ref 4.0–10.5)

## 2010-06-26 LAB — POCT CARDIAC MARKERS
CKMB, poc: 2.1 ng/mL (ref 1.0–8.0)
Myoglobin, poc: 43.1 ng/mL (ref 12–200)
Myoglobin, poc: 64.6 ng/mL (ref 12–200)
Troponin i, poc: <0.05 ng/mL (ref 0.00–0.09)

## 2010-06-26 LAB — HOMOCYSTEINE: Homocysteine: 11.8 umol/L (ref 4.0–15.4)

## 2010-06-26 LAB — PHOSPHORUS: Phosphorus: 3.7 mg/dL (ref 2.3–4.6)

## 2010-06-26 LAB — URINE CULTURE
Colony Count: NO GROWTH
Special Requests: NEGATIVE

## 2010-06-26 LAB — CARDIAC PANEL(CRET KIN+CKTOT+MB+TROPI)
Relative Index: 2.8 — ABNORMAL HIGH (ref 0.0–2.5)
Total CK: 57 U/L (ref 7–232)
Total CK: 76 U/L (ref 7–232)

## 2010-06-26 LAB — APTT: aPTT: 28 seconds (ref 24–37)

## 2010-06-26 LAB — LIPID PANEL
Cholesterol: 186 mg/dL (ref 0–200)
Total CHOL/HDL Ratio: 6.2 RATIO

## 2010-06-26 LAB — TSH: TSH: 2.395 u[IU]/mL (ref 0.350–4.500)

## 2010-06-26 LAB — PROTIME-INR: Prothrombin Time: 12.7 seconds (ref 11.6–15.2)

## 2010-06-26 LAB — HEMOGLOBIN A1C: Hgb A1c MFr Bld: 6.1 % (ref 4.6–6.1)

## 2010-06-27 LAB — APTT: aPTT: 26 seconds (ref 24–37)

## 2010-06-27 LAB — HEMOGLOBIN A1C
Hgb A1c MFr Bld: 6.1 % (ref 4.6–6.1)
Mean Plasma Glucose: 128 mg/dL

## 2010-06-27 LAB — GLUCOSE, CAPILLARY
Glucose-Capillary: 140 mg/dL — ABNORMAL HIGH (ref 70–99)
Glucose-Capillary: 98 mg/dL (ref 70–99)
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
## 2010-07-25 NOTE — Consult Note (Signed)
NAME:  Jose Short, Jose Short NO.:  [id number]   MEDICAL RECORD NO.:  [id number]          PATIENT TYPE:  INP   LOCATION:  3729                         FACILITY:  [facility]   PHYSICIAN:  Noralyn Pick. Eden Emms, MD, FACCDATE OF BIRTH:  Oct 02, 1970   DATE OF CONSULTATION:  04/11/2008  DATE OF DISCHARGE:                                 CONSULTATION   This is a 40 year old patient transferred from [facility] per  Dr. Dorris Singh.  Apparently, she talked to Dr. Antoine Poche last night.  The patient is a noncompliant diabetic who had chest pain on Thursday  and Friday.  He had some presyncopal type spells with lightheadedness.  He subsequently developed somewhat atypical central chest pain.  The  pain was a cramping-type pain, it was not sharp.  It did not radiate  anywhere, was not associated with shortness of breath or palpitations.   Pain resolved on Friday.  He has not had any recurrences, but was  transferred down here for catheterization.   The patient indicates that he had question of a myocarditis a few years  ago.  He indicates being hospitalized at [facility], but I could not  find any records on this.   He had a nonischemic Myoview in 2007.   Unfortunately, he has had issues regarding insurance and noncompliance.  He is a diabetic and has not taken his Glucophage in weeks.   He is currently pain free.   His other cardiac risk factors include positive family history.  Although, he is a nonsmoker, he uses chewing tobacco and snuff.   The patient also has a history of hypertension.  Lipid status is  unknown, but I suspect it is poor.   REVIEW OF SYSTEMS:  Otherwise negative.   PAST MEDICAL HISTORY:  Remarkable for:  1. Hypertension.  2. Diabetes.  3. Smokeless tobacco use.  4. Previous hernia repair.  5. Tonsillectomy.   The patient is married.  He just got a job at [employer].  He drinks on occasion.  He uses snuff and denies  any other drugs.  He is otherwise sedentary.   ALLERGIES:  PENICILLIN   He was on no medications due to noncompliance prior to admission.   PHYSICAL EXAMINATION:  GENERAL:  Pertinent exam is remarkable for an  obese male, in no distress.  He has multiple tattoos.  VITAL SIGNS:  Blood pressure is 140/70, pulse is 68 and regular, and  respiratory rate 14 and afebrile.  HEENT:  Unremarkable.  Carotids normal without bruit.  No  lymphadenopathy, thyromegaly, or JVP elevation.  LUNGS:  Clear.  Good diaphragmatic motion.  No wheezing.  S1 and S2.  Normal heart sounds.  PMI normal.  ABDOMEN:  Benign.  Bowel sounds positive.  No AAA.  No tenderness.  No  bruit.  No hepatosplenomegaly.  No hepatojugular reflux or tenderness.  EXTREMITIES:  Distal pulses are intact.  No edema.  NEUROLOGIC:  Nonfocal.  SKIN:  Warm and dry.  No muscular weakness.   His electrocardiogram shows no acute changes with insignificant Q-waves  in the inferior leads.  LABORATORY WORK:  Remarkable for negative cardiac panel and hemoglobin  A1c was only 6.1.  His TSH was 2.3.  Lipid profile showed an LDL of 114.  BNP was less than 30.   Chest x-ray done, April 10, 2008, showed mild basilar atelectasis.   IMPRESSION:  1  The patient actually has atypical chest pain.  He had a  normal Myoview in 2007.  Given his diabetes noncompliance and difficulty  with followup, I think it is reasonable to proceed with diagnostic heart  cath and risks were discussed.  He is willing to proceed.  Hopefully, he  will not have any coronary artery disease.  1. Diabetes, what appear to be borderline.  I think, it is reasonable      for him to go home on Glucophage.  We will not start this prior to      his heart catheterization.  I would probably discharge him home      with 500 of Glucophage as an outpatient.  We will cover him with a      sliding scale as I did up at [facility].  2. Hypertension.  The patient should be able to  afford low-dose      lisinopril.  Given his borderline blood pressure, I think, 10 mg of      lisinopril would be in order.  We can start this here in the      hospital.   Further recommendation based on results of his heart cath.      Noralyn Pick. Eden Emms, MD, [facility]  Electronically Signed     PCN/MEDQ  D:  04/11/2008  T:  04/11/2008  Job:  [id number]

## 2010-07-25 NOTE — Cardiovascular Report (Signed)
NAME:  Jose Short, Jose Short NO.:  [id number]   MEDICAL RECORD NO.:  [id number]          PATIENT TYPE:  INP   LOCATION:  3729                         FACILITY:  [facility]   PHYSICIAN:  Rollene Rotunda, MD, FACCDATE OF BIRTH:  1970/06/12   DATE OF PROCEDURE:  04/12/2008  DATE OF DISCHARGE:  04/12/2008                            CARDIAC CATHETERIZATION   PRIMARY CARE PHYSICIAN:  None.   CARDIOLOGIST:  Noralyn Pick. Eden Emms, MD, [facility]   PROCEDURE:  Left heart catheterization/coronary arteriography.   INDICATIONS:  Evaluate the patient with chest pain, suggestive unstable  angina.  He had multiple cardiovascular risk factors.   PROCEDURE NOTE:  Left heart catheterization was performed via the right  femoral artery.  The artery was cannulated using anterior wall puncture.  A #6-French arterial sheath was inserted via the modified Seldinger  technique.  Preformed Judkins and a pigtail catheter were utilized.  The  patient tolerated the procedure well and left lab in stable condition.   RESULTS:  Hemodynamics:  LV 128/7 and AO 126/81.  Coronaries:  Left main was normal.  The LAD had mid 25% stenosis.  There  was a small diagonal which had ostial 25% stenosis.  There was a ramus  intermediate which was large and normal.  The circ had ostial 25%  stenosis.  The AV groove was otherwise normal.  Mid obtuse marginal was  moderate size and normal.  The right coronary artery was a dominant  vessel.  There were mild luminal irregularities in the proximal mid  vessel.  The PDA was small and normal.  There were 2 small  posterolaterals which were normal.  Left ventriculogram:  The left ventriculogram was obtained in the RAO  projection.  The EF was 65% with normal wall motion.   CONCLUSION:  Minimal coronary artery plaque.  Normal left ventricular  function.   PLAN:  No further cardiac workup is planned.  The patient will continue  with primary risk reduction.  He is going to see a new  physician in  [place] as a primary care doctor for management of hypertension and  his diabetes.  If he continues to have dizziness and chest pain, we  would be happy to see him back for further evaluation.  However, we will  defer to his primary care doctor.      Rollene Rotunda, MD, [facility]  Electronically Signed     JH/MEDQ  D:  04/12/2008  T:  04/12/2008  Job:  [id number]

## 2010-07-25 NOTE — Op Note (Signed)
NAME:  Jose Short, Jose Short NO.:  [id number]   MEDICAL RECORD NO.:  [id number]          PATIENT TYPE:  OIB   LOCATION:  5127                         FACILITY:  [facility]   PHYSICIAN:  Cherylynn Ridges, M.D.    DATE OF BIRTH:  [birth year]   DATE OF PROCEDURE:  08/08/2007  DATE OF DISCHARGE:                               OPERATIVE REPORT   PREOPERATIVE DIAGNOSIS:  Recurrent paraumbilical ventral hernia with  incarcerated omentum.   POSTOPERATIVE DIAGNOSIS:  Recurrent paraumbilical ventral hernia with  incarcerated omentum.   PROCEDURE:  Laparoscopic ventral hernia repair with 15 x 20 cm Proceed  mesh.   SURGEON:  Marta Lamas. Lindie Spruce, M.D.   ASSISTANT:  Dr. Janee Morn.   ANESTHESIA:  General and endotracheal.   ESTIMATED BLOOD LOSS:  100 mL.   COMPLICATIONS:  None.   CONDITION:  Stable.   FINDINGS:  Incarcerated omentum and a hernia measuring approximately 10  x 12 cm in size.   INDICATIONS FOR OPERATION:  The patient is a 40 year old with a large  hernia and with incarcerated omentum who now comes in for elective  repair.   OPERATION:  The patient was taken to the operating room, placed on table  in supine position.  After an adequate general endotracheal anesthetic  was administered, he was prepped and draped in the usual sterile manner  exposing the entire abdomen.   The initial Hasson cannula was placed in the right lower quadrant.  It  was done through an open technique dissecting down to and through the  posterior rectus sheath.  An 0-Vicryl was passed on the anterior fashion  in order to secure the Hasson in place and carbon dioxide insufflation  was instilled through that cannula entered peritoneal cavity.  We then  placed a left upper quadrant 11-mm cannula, a right upper quadrant 11-mm  cannula, and a right mid abdominal 5-mm cannula under direct vision.  These all of the cannulas that were used in order to dissect off and  securing the Proceed mesh.   The  omentum was stuck up into the hernia defect and good portion of the  operation was spent dissecting that free.  We were able to clear the  omentum completely out of the hernia defect.  We defined the edges and  went 5 cm from the edges in order to mark the fascial point where we  wanted our mesh to attach.  It was at that point that we measured and  this was about 12 x 15 cm in size; and therefore, we used a 20 x 15 cm  piece of Proceed mesh and implanted into the peritoneal cavity.   We marked the mesh on the anterior as rough and the posterior or the  smooth abdominal side with letters A, B, X, and F.  A was superior, X  was inferior, B was on the left, and F was on the right.  We applied #1  pop-off Novafils and tied them to the mesh before inserting it through  the right lower quadrant cannula.  The omentum now was dissected free  from the  sac was retrieved using Endosac bag and brought out the right  lower quadrant.  We are able to use an endosnare in order to catch the  sutures that had been placed on the mesh and bringing it up to the  abdominal wall.  There were some adjustments necessary in order to  provide for adequate tension.  We did tack it down to the fascia with  the sutures and then we used a tagging stapling device in order to tack  it circumferentially and securing and covering the entire defect in the  center portion of the abdomen.  This was done, we have been tacking from  both sides.  We are able to get a good circumferential tacking and  coverage of the hernia defect.  Once this was done, we removed all  cannulas, and we did irrigate with small amount of saline.  The larger  cannula sites was closed with staples, but smaller incisions were closed  with Dermabond and Steri-Strips.  All counts were correct.  The binding  was applied.      Cherylynn Ridges, M.D.  Electronically Signed     JOW/MEDQ  D:  08/08/2007  T:  08/09/2007  Job:  [id number]

## 2010-07-25 NOTE — Discharge Summary (Signed)
NAME:  Jose Short, Jose Short NO.:  [id number]   MEDICAL RECORD NO.:  [id number]          PATIENT TYPE:  INP   LOCATION:  3729                         FACILITY:  [facility]   PHYSICIAN:  Jose Pick. Eden Emms, MD, FACCDATE OF BIRTH:  12/20/1970   DATE OF ADMISSION:  04/11/2008  DATE OF DISCHARGE:  04/12/2008                               DISCHARGE SUMMARY   PRIMARY CARE Jose Short:  The patient plans to follow up with Dr. Lilyan Short in [place].   PRIMARY CARDIOLOGIST:  The patient was seen by Dr. Eden Short here.   DISCHARGE DIAGNOSIS:  Chest pain.   SECONDARY DIAGNOSES:  1. Hypertension.  2. Hyperlipidemia.  3. Diabetes mellitus.  4. Medication nonadherence.  5. Obesity.  6. Hernia repair.  7. Status post tonsillectomy.   ALLERGIES:  PENICILLIN.   PROCEDURES:  Left heart cardiac catheterization revealing minimal  nonobstructive coronary artery disease.   HISTORY OF PRESENT ILLNESS:  A 40 year old Caucasian male with above  problem list.  He has been off his diabetes and hypertension medicines  for some time secondary to finances.  He was in his usual state of  health January 29 when he developed chest discomfort lasting  approximately 2 hours associated with dizziness.  Presented to [facility] where his ECG showed no acute changes and enzymes were  negative.  He was admitted over there with plan to transfer to [facility] once the weather permitted.   HOSPITAL COURSE:  The patient presented to [facility] on January 31.  He  had no recurrent chest discomfort.  His lab work from [facility] was  reviewed and revealed negative cardiac markers x4.  Given the patient's  risk factors, symptoms, it was felt that he would benefit from left  heart cardiac catheterization which was performed on February 1  revealing 25% stenosis in the mid LAD as well as first diagonal and left  circumflex.  All-in-all, the patient had nonobstructive disease with an  EF of 65%.  We  have reinitiated ACE inhibitor and statin therapy and I  have also recommended that he reinitiate metformin therapy on February  3.  The patient has followup with Dr. Gerda Short, who will be his new  primary care Jose Short.  The patient is being discharged home today in  good condition.   DISCHARGE LABS:  Hemoglobin 13.8, hematocrit 40.4, WBC 5.3, platelets  294,000, INR 0.9.  Sodium 135, potassium 4.6, chloride 104, CO2 26, BUN  15, creatinine 0.70, glucose 121, calcium 9.0, phosphorus 3.79, 82.3  hemoglobin A1c 6.1, CK 57, MB 1.8, troponin-I 0.02, total cholesterol  86, triglycerides 211, HDL 30, LDL 114.  TSH 2.395.  Homocysteine 11.8.  Urine culture showed no growth.   DISPOSITION:  The patient will be discharged home today in good  condition.   FOLLOWUP PLANS AND APPOINTMENTS:  We have asked to obtain primary a care  Jose Short and he plans to see Dr. Lilyan Short in 1 to 2 weeks.   DISCHARGE MEDICATIONS:  1. Aspirin 81 mg daily.  2. Metformin 500 mg daily to be resumed April 14, 2008.  3. Lisinopril 10 mg daily.  4. Pravastatin 40 mg nightly.   OUTSTANDING LAB STUDIES:  None.   DURATION DISCHARGE ENCOUNTER:  60 minutes including physician time.      Jose Short, ANP      Jose Pick. Eden Emms, MD, [facility]  Electronically Signed    CB/MEDQ  D:  04/12/2008  T:  04/12/2008  Job:  [id number]   cc:   Lorin Picket A. Jose Diss, MD
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
## 2010-07-28 NOTE — H&P (Signed)
NAME:  Jose Short, Jose Short NO.:  [id number]   MEDICAL RECORD NO.:  [id number]          PATIENT TYPE:  INP   LOCATION:  1509                         FACILITY:  [facility]   PHYSICIAN:  Della Goo, M.D. DATE OF BIRTH:  08-Apr-1970   DATE OF ADMISSION:  04/13/2006  DATE OF DISCHARGE:                              HISTORY & PHYSICAL   This is an unassigned patient.   CHIEF COMPLAINTS:  Severe muscle pain, both legs.   HISTORY OF PRESENT ILLNESS:  This is a 40 year old male who presented to  the emergency department secondary to complaints of severe muscular  pain, both thighs and lower legs for two days following heavy exercise.  The patient reports having difficulty walking and standing up and moving  secondary to the pain and stiffness.  He also reports having mild  nausea, no vomiting.  No chest pain.  No shortness of breath.   The patient was evaluated in the emergency department and laboratory  studies revealed a CPK level of 20,440, and the patient was referred for  admission and IV fluids were started.   PAST MEDICAL HISTORY:  Gastroesophageal reflux disease.   PAST SURGICAL HISTORY:  Status post umbilical hernia repair.   ALLERGIES:  PENICILLIN which causes a rash.   SOCIAL HISTORY:  Works as a Production designer, theatre/television/film.  No history of tobacco  usage.  Reports drinking one to two beers nightly.  No history of  illicit drug usage.   FAMILY HISTORY:  Family history is positive for coronary artery disease  in both his grandparents, positive hypertension in both of his parents  and an uncle, positive diabetes mellitus in his father.  Positive cancer  in a maternal aunt, unknown type.   REVIEW OF SYSTEMS:  Pertinent for mentioned above.   PHYSICAL EXAMINATION:  GENERAL:  This is an obese 40 year old male in  discomfort but no acute distress.  He reports the pain level is  currently of 6/10 and this is after morphine 6 mg IV has been given.  VITAL SIGNS:   Temperature 98, blood pressure initially 181/115, heart  rate 96, respirations 16.  Blood pressure after morphine 143/81.  Oxygen  saturation 98%.  HEENT:  Normocephalic, atraumatic.  There is no scleral icterus.  Pupils  are equally, round, and reactive to light.  Extraocular muscles are  intact.  Funduscopic benign.  Oropharynx is clear.  NECK:  Supple.  Full range of motion.  No thyromegaly, adenopathy or  jugular venous distension.  CARDIOVASCULAR:  Regular rate and rhythm.  No murmurs, gallops or rubs.  LUNGS:  Clear to auscultation bilaterally.  ABDOMEN:  Positive bowel sounds.  Soft, nontender, nondistended.  Obese.  No hepatosplenomegaly.  EXTREMITIES:  Without edema.  NEUROLOGIC:  The patient does have mild generalized weakness.  He is  able to move all four of his extremities.  There are no cranial nerve  deficits and no gross motor or sensory deficits.   LABORATORY DATA:  Laboratory studies reveal a sodium of 138, potassium  3.4, chloride 106, CO2 24, BUN 15, creatinine 0.9, glucose 181, calcium  8.8, total  protein 6.6, albumin 3.4, AST 206.  Total CK is 20,440, CK-MB  24.8, relative index 0.1.  Urinalysis reveals urine glucose 100 mg/dL,  large hemoglobin.  Urine protein 13 mg/dL.  The uric acid level 7.8.   ASSESSMENT:  A 40 year old male with an increased pain/myalgias being  admitted with:  1. Rhabdomyolysis.  2. Hyperuricemia.  3. Elevated transaminases.  4. Hyperglycemia.  5. Hypokalemia.  6. Hypertension.   PLAN:  The patient will continue with IV fluids for rehydration therapy.  His potassium will also be repleted.  Colchicine therapy will be started  along with Solu-Medrol for the hyperuricemia.  The patient will be  placed on p.r.n. clonidine for elevated blood pressure.  However, his  elevated blood pressures  are thought to be reactive secondary to pain  and may improve with better pain control.  The hyperuricemia maybe  adding to the patient's pain at  this time.  The CBGs will be checked q.4  h and sliding scale coverage will be ordered p.r.n.  The patient may  have a new diagnosis of type 2 diabetes as well.  An abdominal  ultrasound will be ordered secondary to the elevated transaminases along  with a hepatitis panel and repeat liver function test.  A DVT  prophylaxis and GI prophylaxis has also been ordered.      Della Goo, M.D.  Electronically Signed     HJ/MEDQ  D:  04/14/2006  T:  04/14/2006  Job:  [id number]

## 2010-07-28 NOTE — Op Note (Signed)
[facility]  Patient:    Jose Short, Jose Short                     MRN: [id number] Proc. Date: 05/24/00 Adm. Date:  [id number] Disc. Date: [id number] Attending:  Gennie Alma                           Operative Report  [facility] [place] NUMBER [phone]  PREOPERATIVE DIAGNOSES: 1. Umbilical hernia. 2. Lesion of skin of umbilicus.  POSTOPERATIVE DIAGNOSES: 1. Umbilical hernia. 2. Lesion of skin of umbilicus.  OPERATION: 1. Repair of umbilical hernia with mesh. 2. Excision of small nodule of umbilicus.  SURGEON:  Milus Mallick, M.D.  ANESTHESIA:  General endotracheal anesthesia.  DESCRIPTION OF PROCEDURE:  Under adequate general endotracheal anesthesia, the patients abdomen was prepped and draped in the usual fashion.  There was a small, raised lesion in the depths of the skin of the umbilicus.  It was 2 or 3 mm in diameter.  This lesion was excised with an elliptical excision.  It was sent for routine pathologic study.  The incision was then lengthened for at least 1 cm outside of the umbilicus on either side and carried down through subcutaneous tissue.  A hernia mass was present at the base of the incision and appeared to be comprised of preperitoneal fat that was herniating through a defect that at first felt as though it were approximately 1.5 cm in diameter.  The subcutaneous tissue was cleared away from the defect circumferentially until significant fascia was exposed.  There was some of the fat that was stuck to the edge of the defect, and when this was trimmed off, the defect was actually approximately 4 to 5 cm in diameter.  The preperitoneal fat was reduced, and the defect was closed with interrupted sutures of 0 Novofil.  When this was completed, a portion of atrium mesh was cut to appropriate size approximately 5 cm in diameter and applied to the fascia.  It was fixed with interrupted sutures of 0 Novofil.  An attempt was made to  [facility] the skin back to replicate the previous umbilicus, but this placed traction on the mesh, and, for this reason, it was not attempted.  The skin was approximated with interrupted subcuticular sutures of 4-0 Vicryl.  Half inch Steri-Strips were applied to the skin, and sterile dressing was applied.  Estimated blood loss for the procedure was less than 50 cc.  The patient tolerated the procedure well and left the operating room in satisfactory condition. DD:  05/24/00 TD:  05/26/00 Job: [id number]

## 2010-07-28 NOTE — Discharge Summary (Signed)
NAME:  Jose Short, Jose Short NO.:  [id number]   MEDICAL RECORD NO.:  [id number]          PATIENT TYPE:  INP   LOCATION:  1509                         FACILITY:  [facility]   PHYSICIAN:  Wilson Singer, M.D.DATE OF BIRTH:  1970-04-10   DATE OF ADMISSION:  04/13/2006  DATE OF DISCHARGE:  04/17/2006                               DISCHARGE SUMMARY   HISTORY:  This is a 40 year old man who presented to the emergency room  with severe muscle pain in his thighs and legs for 2 days following  heavy exercise at [employer].  He was admitted with a significantly  elevated CPK of over 20,000.  Please see initial history and physical  examination by Dr. Della Goo.   PAST MEDICAL HISTORY:  Significant for gastroesophageal reflux disease.   PAST SURGICAL HISTORY:  Significant for umbilical hernia repair.   SOCIAL HISTORY:  He works as a Estate agent.  No history of tobacco  abuse.  He reports drinking 1-2 beers nightly.  He does not have a  primary care physician.   HOSPITAL PROGRESS:  On admission, he was hypertensive and in pain.  He  was treated with intravenous fluids, and his CPK and enzymes came down  quite nicely from a level of 21,244 down to 11,235 and down to today of  3421.  During this time, his renal function has not been compromised,  and his BUN and creatinine have remained in the normal range with a GFR  of greater than 60 L/minute.  Today, his muscle pains are reduced but  still present.  He is able to tolerate food and fluids, and he is  mobilizing.   PHYSICAL EXAMINATION:  VITAL SIGNS:  He is afebrile.  Blood pressure is  still somewhat elevated at 155/95, pulse 60 per minute.  LUNGS:  Fields are clear.   Sodium is 139, potassium 4.5, bicarb 31, BUN 10, creatinine 0.85, CPK  3421 and glucose 111.   He is being discharged today and will follow up with a primary care  physician, and I have given him the name of one.   FINAL DISCHARGE DIAGNOSES:  1. Rhabdomyolysis, resolving.  2. Elevated blood pressure.  3. Elevated glucose.   He is stable to be discharged and will follow up with a primary care  physician to look at the other issues, including blood pressure, glucose  and obesity.      Wilson Singer, M.D.  Electronically Signed     NCG/MEDQ  D:  04/17/2006  T:  04/17/2006  Job:  [id number]

## 2010-07-28 NOTE — H&P (Signed)
NAME:  Jose Short, Jose Short NO.:  [id number]   MEDICAL RECORD NO.:  [id number]          PATIENT TYPE:  INP   LOCATION:  0101                         FACILITY:  [facility]   PHYSICIAN:  Hollice Espy, M.D.DATE OF BIRTH:  May 16, 1970   DATE OF ADMISSION:  07/25/2005  DATE OF DISCHARGE:                                HISTORY & PHYSICAL   ATTENDING PHYSICIAN:  Melissa L. Ladona Ridgel, M.D.   PRIMARY CARE PHYSICIAN:  None.   CONSULTATIONS:  Lorain Childes, M.D. [facility].   CHIEF COMPLAINT:  Chest pain.   HISTORY OF PRESENT ILLNESS:  The patient is a 40 year old white male with  past medical history of hypertension, obesity, and medical noncompliance,  who presents to the emergency room after a 1-day episode of chest pain.  The  pain initially started yesterday at 5:15 and has been ongoing ever since,  described as just lateral to the left side of the sternum, severely heavy  pressure with radiation and numbness down his left arm.  The patient had no  associated shortness of breath, but symptoms continued to persist with range  into the severity, but never completely going away.  The patient could not  take it anymore and came into the emergency room.  The only other previous  medical history as he previously was on hypertensive medications, but  stopped taking them secondary to financial issues.  When the patient arrived  to the emergency room, he had labs done, all of which were essentially  normal including a BNP and cardiac enzymes.  EKG, however, showed some  flipped T waves in his inferior leads.  When compared to a previous EKG of  June of 2006, this is a new finding.  Currently, the patient after receiving  nitroglycerin and aspirin says the chest pressure has gone down slightly,  but again is not completely relieved.  He has now gotten a dose of IV  morphine.  He has no other complaints.  He denies any headaches, vision  changes, dysphagia,  palpitations, wheezing, coughing, abdominal pain,  hematuria, dysuria, shortness of breath, constipation, diarrhea, focal  extremity weakness or pain.   REVIEW OF SYSTEMS:  Otherwise negative.   PAST MEDICAL HISTORY:  Obesity, tobacco abuse, and hypertension.   MEDICATIONS:  None.   ALLERGIES:  PENICILLIN.   SOCIAL HISTORY:  He denies any excessive alcohol, drug use, or cigarette  smoking.  He does, however, chew tobacco.   FAMILY HISTORY:  Notable for an uncle who died of a massive MI at age 102.   PHYSICAL EXAMINATION:  VITAL SIGNS:  Temperature 98.1, heart rate 106, blood  pressure 157/99, respirations 20, O2 saturation 98% on room air.  GENERAL:  The patient is alert and oriented in no acute distress.  HEENT:  Normocephalic and atraumatic.  Mucous membranes are moist.  He has  no carotid bruits.  HEART:  Regular rate and rhythm, S1 and S2.  LUNGS:  Clear to auscultation bilaterally.  ABDOMEN:  Soft, nontender, obese, positive bowel sounds.  EXTREMITIES:  No cyanosis, clubbing, or edema.   LABORATORY DATA:  BNP less than 30. White count 7.7, H&H 14.9 and 44.5, MCV  80, platelet count 339, no shift.  Sodium 135, potassium 3.7, chloride 105,  bicarb 25, BUN 11, creatinine 0.9, glucose 128.  LFT's are only noted for an  ALT of 67.  Coags normal. CPK 82.1, MB 1, troponin I less than 0.05.   EKG shows again flipped T waves in the inferior leads.   ASSESSMENT:  1.  Chest pain with questionable EKG changes.  I have discussed this with      Dr. Bascom Levels of [facility].  She will see the patient in the      morning and in the meantime we will follow serial sets of enzymes.  If      these enzymes stay negative, we will check a repeat EKG in the morning      as well as check a fasting lipid profile.  The patient likely will get a      stress test.  2.  Hypertension.  Currently, the patient is not on any medications at this      time.  His blood pressure is slightly elevated.  I  would favor holding      on beta blocker until post stress test.  Likely will then start the      patient on either a beta blocker or ACE inhibitor.  3.  Medical noncompliance.  4.  Tobacco abuse.  We will provide counseling.      Hollice Espy, M.D.  Electronically Signed     SKK/MEDQ  D:  07/25/2005  T:  07/26/2005  Job:  [id number]   cc:   Lorain Childes, M.D. [facility]  [address]  [place]  [place] [address]

## 2010-07-28 NOTE — Discharge Summary (Signed)
NAME:  Jose Short, Jose Short NO.:  [id number]   MEDICAL RECORD NO.:  [id number]          PATIENT TYPE:  INP   LOCATION:  1417                         FACILITY:  [facility]   PHYSICIAN:  Melissa L. Ladona Ridgel, MD  DATE OF BIRTH:  [birth year]   DATE OF ADMISSION:  07/25/2005  DATE OF DISCHARGE:  07/28/2005                                 DISCHARGE SUMMARY   CHIEF COMPLAINT:  Chest pain.   DISCHARGE DIAGNOSES:  1.  Noncardiac chest pain.  Please note the patient was admitted to      telemetry.  He was ruled out using cardiac markers which were all      negative.  His EKG initially was thought to show perhaps some subtle T      wave changes.  However, repeat on the day following admission showed no      acute ST-T wave changes.  The patient underwent a two-day Myoview study      of his heart which was negative for any ischemia. Showed an ejection      fraction of 73%.  The patient's symptoms were actually relieved on the      second day of admission with GI cocktail and the initiation of a proton      pump inhibitor.  The patient has been requested to establish a primary      care physician for further work-up as an outpatient.  2.  Possible GI source for his chest discomfort. As stated, the patient      responded very favorably to a GI cocktail and the initiation of      Protonix.  At the time of discharge he was sent home with only an      occasional intermittent discomfort in his epigastric area on Nexium and      a dose of Pepcid in the evening.  I have asked him to please establish a      primary care physician and obtain a GI referral for work-up as an      outpatient.  Hypercholesterolemia.  The patient was counseled on dietary changes, weight  loss and was started on Zocor 40 mg daily.   DISCHARGE MEDICATIONS:  1.  Zocor 40 mg one daily.  2.  Nexium 40 mg once daily in the a.m.  3.  Pepcid 20 mg at bedtime.   HISTORY OF PRESENT ILLNESS:  The patient is a 40 year old,  obese, white male  who presented to the emergency room with the onset of several days of  epigastric tenderness.  He was placed on the telemetry floor.  Serial  cardiac markers were obtained which were all negative.  A two-day Myoview  study was obtained which showed no obvious ischemia and an ejection fraction  of 73%.  A 2-D echo was not obtained at this time.  Interestingly enough, on  the morning after admission, the patient continued to complain of severe  epigastric tenderness radiating to the left chest.  He was given a GI  cocktail with significant improvement in his symptoms.  He was further  started on a proton pump  inhibitor and over the next 24 hours really only  complained of intermittent twinges of discomfort.  He was able to eat full  meals and participated actively in his stress testing.   PHYSICAL EXAMINATION:  VITAL SIGNS:.  On the day of discharge, the patient's  vital signs were stable.  He was afebrile and blood pressure was 139/79,  pulse 72, respirations were 20.  Saturations were 98%.  GENERAL:  Generally, this is an obese white male in no acute distress.  HEENT:  He is normocephalic, atraumatic.  Pupils are equal, round and  reactive to light.  Extraocular muscles intact.  Mucous membranes are moist.  NECK:  Neck is supple.  There is no JVD, no lymph nodes, no carotid bruits.  CHEST:  Clear to auscultation.  No rhonchi, rales or wheezes.  He no longer  has exquisite tenderness in the epigastric area which is how he presented on  the morning after admission.  With palpation that day, he had a very tender  area that has since resolved.  EXTREMITIES:  Showed no clubbing, cyanosis or edema.  She is pale.  NEUROLOGIC:  Intact.   LABORATORY DATA:  As stated.  The patient's cardiac monitor were negative  for any ischemia.  His lipase and amylase were all within normal limits.  Lipid profile showed cholesterol of 185, a triglyceride level of 208, HDL  23, LDL 120.   Hemoglobin A1C was not obtained during this admission.  His B  natriuretic peptide was less than 30.   At this time the patient has seemed stable for discharge with a diagnosis of  possible noncardiac GI source for his chest and abdominal discomfort.  He  was instructed to establish a primary care physician with his insurance  company and obtain a GI consult for outpatient evaluation.      Melissa L. Ladona Ridgel, MD  Electronically Signed     MLT/MEDQ  D:  07/29/2005  T:  07/29/2005  Job:  [id number]

## 2010-08-29 ENCOUNTER — Emergency Department (HOSPITAL_COMMUNITY)
Admission: EM | Admit: 2010-08-29 | Discharge: 2010-08-29 | Disposition: A | Discharge status: Home / Self Care | Payer: Self-pay | Attending: Emergency Medicine | Admitting: Emergency Medicine

## 2010-08-29 DIAGNOSIS — M545 Low back pain, unspecified: Secondary | ICD-10-CM | POA: Insufficient documentation

## 2010-08-29 DIAGNOSIS — R110 Nausea: Secondary | ICD-10-CM | POA: Insufficient documentation

## 2010-08-29 DIAGNOSIS — E119 Type 2 diabetes mellitus without complications: Secondary | ICD-10-CM | POA: Insufficient documentation

## 2010-08-29 DIAGNOSIS — I10 Essential (primary) hypertension: Secondary | ICD-10-CM | POA: Insufficient documentation

## 2010-08-29 DIAGNOSIS — E7800 Pure hypercholesterolemia, unspecified: Secondary | ICD-10-CM | POA: Insufficient documentation

## 2010-10-13 DIAGNOSIS — E119 Type 2 diabetes mellitus without complications: Secondary | ICD-10-CM | POA: Insufficient documentation

## 2010-10-13 DIAGNOSIS — I10 Essential (primary) hypertension: Secondary | ICD-10-CM | POA: Insufficient documentation

## 2010-10-13 DIAGNOSIS — M25569 Pain in unspecified knee: Secondary | ICD-10-CM | POA: Insufficient documentation

## 2010-10-14 ENCOUNTER — Emergency Department (HOSPITAL_COMMUNITY)
Admission: EM | Admit: 2010-10-14 | Discharge: 2010-10-14 | Disposition: A | Discharge status: Home / Self Care | Payer: Self-pay | Attending: Emergency Medicine | Admitting: Emergency Medicine

## 2010-10-14 DIAGNOSIS — M25569 Pain in unspecified knee: Secondary | ICD-10-CM

## 2010-10-14 HISTORY — DX: Essential (primary) hypertension: I10

## 2010-10-14 MED ORDER — IBUPROFEN 800 MG PO TABS
800.0000 mg | ORAL_TABLET | Freq: Once | ORAL | Status: AC
  Administered 2010-10-14: 800 mg via ORAL
  Filled 2010-10-14: qty 2

## 2010-10-14 MED ORDER — HYDROCODONE-ACETAMINOPHEN 5-325 MG PO TABS: 1.0000 | ORAL_TABLET | ORAL | Status: AC | PRN

## 2010-10-14 MED ORDER — HYDROCODONE-ACETAMINOPHEN 5-325 MG PO TABS
2.0000 | ORAL_TABLET | Freq: Once | ORAL | Status: AC
  Administered 2010-10-14: 2 via ORAL
  Filled 2010-10-14: qty 2

## 2010-10-14 MED ORDER — IBUPROFEN 800 MG PO TABS: 800.0000 mg | ORAL_TABLET | Freq: Three times a day (TID) | ORAL | Status: AC

## 2010-10-14 NOTE — ED Notes (Signed)
Pain w/o injury left knee. Feels "popping" and giving out.

## 2010-10-14 NOTE — ED Provider Notes (Signed)
History     CSN: [id number] Arrival date & time: 10/14/2010  1:40 AM  Chief Complaint  Patient presents with  . Knee Pain   Patient is a 40 y.o. male presenting with knee pain. The history is provided by the patient.  Knee Pain This is a chronic (has been evaluated in the past with MRI, has chronic changes) problem. Episode onset: walking in [place] and felt like knee was going to give way at least 3 times. Progression since onset: pain now continuous. Pertinent negatives include no chest pain, no abdominal pain, no headaches and no shortness of breath. The symptoms are aggravated by bending, walking and standing. The symptoms are relieved by nothing. He has tried nothing for the symptoms.    Past Medical History  Diagnosis Date  . Hypertension   . Diabetes mellitus     Past Surgical History  Procedure Date  . Hernia repair     No family history on file.  History  Substance Use Topics  . Smoking status: Never Smoker   . Smokeless tobacco: Not on file  . Alcohol Use: Yes      Review of Systems  Respiratory: Negative for shortness of breath.   Cardiovascular: Negative for chest pain.  Gastrointestinal: Negative for abdominal pain.  Neurological: Negative for headaches.  All other systems reviewed and are negative.    Physical Exam  BP 133/86  Pulse 72  Temp 97.6 F (36.4 C)  Resp 18  Ht 6\' 2"  (1.88 m)  Wt 335 lb (151.955 kg)  BMI 43.01 kg/m2  SpO2 100%  Physical Exam  Nursing note and vitals reviewed. Constitutional: He is oriented to person, place, and time. He appears well-developed and well-nourished. He appears distressed.       morbidly obese  HENT:  Head: Normocephalic and atraumatic.  Eyes: EOM are normal.  Neck: Neck supple.  Cardiovascular: Normal rate, normal heart sounds and intact distal pulses.   Pulmonary/Chest: Effort normal and breath sounds normal.  Musculoskeletal: Edema: left knee with mild crepitus, no effusion. Pulses 2+       Left  knee with mild crepitus, no effusion, pulses 2+  Neurological: He is alert and oriented to person, place, and time. He has normal reflexes.  Skin: Skin is warm and dry.    ED Course  Procedures  MDM Patient placed in knee immobilizer and given analgesics    Nicoletta Dress. Colon Branch, MD 10/14/10 [phone]

## 2010-12-04 LAB — COMPREHENSIVE METABOLIC PANEL
AST: 34
Alkaline Phosphatase: 65
BUN: 13
CO2: 29
Chloride: 105
Creatinine, Ser: 0.77
GFR calc non Af Amer: >60
Total Bilirubin: 0.7

## 2010-12-04 LAB — URINALYSIS, ROUTINE W REFLEX MICROSCOPIC
Bilirubin Urine: NEGATIVE
Glucose, UA: NEGATIVE
Hgb urine dipstick: NEGATIVE
Ketones, ur: NEGATIVE
Protein, ur: NEGATIVE

## 2010-12-04 LAB — CBC
HCT: 41.1
Hemoglobin: 13.6
MCHC: 33
MCV: 81.3
Platelets: 355
RDW: 13.9

## 2010-12-04 LAB — DIFFERENTIAL
Basophils Absolute: 0.1
Basophils Relative: 1
Eosinophils Relative: 3
Lymphocytes Relative: 31

## 2010-12-04 LAB — LIPASE, BLOOD: Lipase: 24

## 2010-12-06 LAB — BASIC METABOLIC PANEL
BUN: 9
Calcium: 9.8
Creatinine, Ser: 0.75
GFR calc non Af Amer: >60
Potassium: 4.2

## 2010-12-06 LAB — CBC
HCT: 44.1
Platelets: 342
RDW: 13.5
WBC: 6.3

## 2010-12-06 LAB — DIFFERENTIAL
Basophils Absolute: 0.1
Lymphocytes Relative: 30
Lymphs Abs: 1.9
Neutro Abs: 3.4
Neutrophils Relative %: 54

## 2010-12-06 LAB — HEMOGLOBIN A1C
Hgb A1c MFr Bld: 6.9 — ABNORMAL HIGH
Mean Plasma Glucose: 168

## 2010-12-11 ENCOUNTER — Encounter (HOSPITAL_COMMUNITY): Payer: Self-pay | Admitting: Emergency Medicine

## 2010-12-11 ENCOUNTER — Emergency Department (HOSPITAL_COMMUNITY)
Admission: EM | Admit: 2010-12-11 | Discharge: 2010-12-11 | Disposition: A | Discharge status: Home / Self Care | Payer: Self-pay | Attending: Emergency Medicine | Admitting: Emergency Medicine

## 2010-12-11 ENCOUNTER — Emergency Department (HOSPITAL_COMMUNITY): Payer: Self-pay

## 2010-12-11 DIAGNOSIS — R42 Dizziness and giddiness: Secondary | ICD-10-CM | POA: Insufficient documentation

## 2010-12-11 DIAGNOSIS — Z87891 Personal history of nicotine dependence: Secondary | ICD-10-CM | POA: Insufficient documentation

## 2010-12-11 DIAGNOSIS — R110 Nausea: Secondary | ICD-10-CM | POA: Insufficient documentation

## 2010-12-11 DIAGNOSIS — I10 Essential (primary) hypertension: Secondary | ICD-10-CM | POA: Insufficient documentation

## 2010-12-11 DIAGNOSIS — R51 Headache: Secondary | ICD-10-CM | POA: Insufficient documentation

## 2010-12-11 DIAGNOSIS — H53149 Visual discomfort, unspecified: Secondary | ICD-10-CM | POA: Insufficient documentation

## 2010-12-11 DIAGNOSIS — E119 Type 2 diabetes mellitus without complications: Secondary | ICD-10-CM | POA: Insufficient documentation

## 2010-12-11 MED ORDER — HYDROCODONE-ACETAMINOPHEN 5-325 MG PO TABS
2.0000 | ORAL_TABLET | Freq: Once | ORAL | Status: AC
  Administered 2010-12-11: 2 via ORAL
  Filled 2010-12-11: qty 2

## 2010-12-11 MED ORDER — KETOROLAC TROMETHAMINE 60 MG/2ML IM SOLN
60.0000 mg | Freq: Once | INTRAMUSCULAR | Status: AC
  Administered 2010-12-11: 60 mg via INTRAMUSCULAR
  Filled 2010-12-11: qty 2

## 2010-12-11 MED ORDER — MORPHINE SULFATE 10 MG/ML IJ SOLN
8.0000 mg | Freq: Once | INTRAMUSCULAR | Status: AC
  Administered 2010-12-11: 10 mg via INTRAMUSCULAR
  Filled 2010-12-11: qty 1

## 2010-12-11 MED ORDER — PROMETHAZINE HCL 25 MG PO TABS: 25.0000 mg | ORAL_TABLET | Freq: Four times a day (QID) | ORAL | Status: DC | PRN

## 2010-12-11 MED ORDER — PROMETHAZINE HCL 12.5 MG PO TABS
25.0000 mg | ORAL_TABLET | Freq: Once | ORAL | Status: AC
  Administered 2010-12-11: 25 mg via ORAL
  Filled 2010-12-11 (×2): qty 1

## 2010-12-11 MED ORDER — HYDROCODONE-ACETAMINOPHEN 5-325 MG PO TABS: 1.0000 | ORAL_TABLET | ORAL | Status: AC | PRN

## 2010-12-11 NOTE — ED Notes (Signed)
Patient c/o headache that started this morning at 0200. Patient denies hx of headache/migranes but states "I have had 3 like this in the past week. Patient reports sensitivity to light, dizziness, and nausea with headache.

## 2010-12-11 NOTE — ED Notes (Signed)
meds given and awaiting ct. nad

## 2010-12-11 NOTE — ED Provider Notes (Signed)
History     CSN: [id number] Arrival date & time: 12/11/2010  9:46 AM  Chief Complaint  Patient presents with  . Headache    dizziness    (Consider location/radiation/quality/duration/timing/severity/associated sxs/prior treatment) Patient is a 40 y.o. male presenting with headaches. The history is provided by the patient.  Headache  This is a new problem. The problem occurs constantly. The problem has been gradually worsening. The headache is associated with nothing. The pain is located in the temporal and occipital region. The quality of the pain is described as throbbing. The pain is at a severity of 9/10. The pain is severe. The pain does not radiate. Associated symptoms include nausea. Pertinent negatives include no anorexia, no fever, no near-syncope, no orthopnea, no palpitations, no syncope, no shortness of breath and no vomiting. He has tried acetaminophen for the symptoms.    Past Medical History  Diagnosis Date  . Hypertension   . Diabetes mellitus     Past Surgical History  Procedure Date  . Hernia repair     Family History  Problem Relation Age of Onset  . Diabetes Mother   . Hypertension Mother   . Diabetes Father   . Hypertension Father   . Stroke Other     History  Substance Use Topics  . Smoking status: Former Smoker -- 3 years    Types: Cigarettes    Quit date: 12/10/1988  . Smokeless tobacco: Current User    Types: Snuff  . Alcohol Use: No      Review of Systems  Constitutional: Negative for fever and activity change.       All ROS Neg except as noted in HPI  HENT: Negative for nosebleeds and neck pain.   Eyes: Positive for photophobia. Negative for discharge.  Respiratory: Negative for cough, shortness of breath and wheezing.   Cardiovascular: Negative for chest pain, palpitations, orthopnea, syncope and near-syncope.  Gastrointestinal: Positive for nausea. Negative for vomiting, abdominal pain, blood in stool and anorexia.  Genitourinary:  Negative for dysuria, frequency and hematuria.  Musculoskeletal: Negative for back pain and arthralgias.  Skin: Negative.   Neurological: Positive for dizziness and headaches. Negative for seizures and speech difficulty.  Psychiatric/Behavioral: Negative for hallucinations and confusion.    Allergies  Penicillins  Home Medications   Current Outpatient Rx  Name Route Sig Dispense Refill  . ASPIRIN 325 MG PO TABS Oral Take 325 mg by mouth daily.        BP 147/86  Pulse 70  Temp(Src) 98.4 F (36.9 C) (Oral)  Resp 20  Ht 6\' 2"  (1.88 m)  Wt 335 lb (151.955 kg)  BMI 43.01 kg/m2  SpO2 97%  Physical Exam  Nursing note and vitals reviewed. Constitutional: He is oriented to person, place, and time. He appears well-developed and well-nourished.  Non-toxic appearance.  HENT:  Head: Normocephalic.  Right Ear: Tympanic membrane and external ear normal.  Left Ear: Tympanic membrane and external ear normal.  Eyes: EOM and lids are normal. Pupils are equal, round, and reactive to light.  Neck: Normal range of motion. Neck supple. Carotid bruit is not present.  Cardiovascular: Normal rate, regular rhythm, normal heart sounds, intact distal pulses and normal pulses.   Pulmonary/Chest: Breath sounds normal. No respiratory distress.  Abdominal: Soft. Bowel sounds are normal. There is no tenderness. There is no guarding.  Musculoskeletal: Normal range of motion.  Lymphadenopathy:       Head (right side): No submandibular adenopathy present.  Head (left side): No submandibular adenopathy present.    He has no cervical adenopathy.  Neurological: He is alert and oriented to person, place, and time. He has normal strength. No cranial nerve deficit or sensory deficit. Coordination normal. GCS eye subscore is 4. GCS verbal subscore is 5. GCS motor subscore is 6.  Skin: Skin is warm and dry.  Psychiatric: He has a normal mood and affect. His speech is normal.    ED Course: Test results given  to pt. Plan discussed.  Procedures (including critical care time)  Labs Reviewed - No data to display No results found.  Dx: Headache   MDM  I have reviewed nursing notes, vital signs, and all appropriate lab and imaging results for this patient. Results for orders placed during the hospital encounter of 05/29/10  DIFFERENTIAL      Component Value Range   Neutrophils Relative 49  43 - 77 (%)   Neutro Abs 2.9  1.7 - 7.7 (K/uL)   Lymphocytes Relative 34  12 - 46 (%)   Lymphs Abs 2.0  0.7 - 4.0 (K/uL)   Monocytes Relative 10  3 - 12 (%)   Monocytes Absolute 0.6  0.1 - 1.0 (K/uL)   Eosinophils Relative 6 (*) 0 - 5 (%)   Eosinophils Absolute 0.4  0.0 - 0.7 (K/uL)   Basophils Relative 1  0 - 1 (%)   Basophils Absolute 0.1  0.0 - 0.1 (K/uL)  CBC      Component Value Range   WBC 6.0  4.0 - 10.5 (K/uL)   RBC 5.07  4.22 - 5.81 (MIL/uL)   Hemoglobin 13.8  13.0 - 17.0 (g/dL)   HCT 16.1  09.6 - 04.5 (%)   MCV 82.2  78.0 - 100.0 (fL)   MCH 27.2  26.0 - 34.0 (pg)   MCHC 33.1  30.0 - 36.0 (g/dL)   RDW 40.9  81.1 - 91.4 (%)   Platelets 282  150 - 400 (K/uL)  POCT CARDIAC MARKERS      Component Value Range   Myoglobin, poc 43.3  12 - 200 (ng/mL)   CKMB, poc 1.3  1.0 - 8.0 (ng/mL)   Troponin i, poc <0.05  0.00 - 0.09 (ng/mL)   Comment       Value:            TROPONIN VALUES IN THE RANGE     OF 0.00-0.09 ng/mL SHOW     NO INDICATION OF     MYOCARDIAL INJURY.                PERSISTENTLY INCREASED TROPONIN     VALUES IN THE RANGE OF 0.10-0.24     ng/mL CAN BE SEEN IN:           -UNSTABLE ANGINA           -CONGESTIVE HEART FAILURE           -MYOCARDITIS           -CHEST TRAUMA           -ARRYHTHMIAS           -LATE PRESENTING MI           -COPD       CLINICAL FOLLOW-UP RECOMMENDED.                TROPONIN VALUES >=0.25 ng/mL     INDICATE POSSIBLE MYOCARDIAL     ISCHEMIA. SERIAL TESTING     RECOMMENDED.  BASIC METABOLIC  PANEL      Component Value Range   Sodium 143  135 - 145  (mEq/L)   Potassium 4.3  3.5 - 5.1 (mEq/L)   Chloride 109  96 - 112 (mEq/L)   CO2 27  19 - 32 (mEq/L)   Glucose, Bld 145 (*) 70 - 99 (mg/dL)   BUN 12  6 - 23 (mg/dL)   Creatinine, Ser 8.11  0.4 - 1.5 (mg/dL)   Calcium 8.5  8.4 - 91.4 (mg/dL)   GFR calc non Af Amer >60  >60 (mL/min)   GFR calc Af Amer    >60 (mL/min)   Value: >60            The eGFR has been calculated     using the MDRD equation.     This calculation has not been     validated in all clinical     situations.     eGFR's persistently     <60 mL/min signify     possible Chronic Kidney Disease.   Ct Head Wo Contrast  12/11/2010  *RADIOLOGY REPORT*  Clinical Data: Worst headache of life.  CT HEAD WITHOUT CONTRAST  Technique:  Contiguous axial images were obtained from the base of the skull through the vertex without contrast.  Comparison: 01/13/2007.  Findings: No acute stroke, intracranial hemorrhage, mass lesion, hydrocephalus, or extra-axial fluid.  Mild atrophy may be present. No subarachnoid blood is seen.  Calvarium is intact.  There is premature basilar vascular calcification.   Sinuses and mastoids are clear.  Little change from priors.  IMPRESSION: Mild atrophy.  No acute intracranial findings.  No change from priors. Premature basilar atherosclerotic change.  Original Report Authenticated By: Elsie Stain, M.D.          Kathie Dike, [place] 12/11/10 1133

## 2010-12-19 LAB — I-STAT 8, (EC8 V) (CONVERTED LAB)
Acid-Base Excess: 1
BUN: 10
Bicarbonate: 26.8 — ABNORMAL HIGH
Chloride: 104
HCT: 46
Hemoglobin: 15.6
Operator id: 151321
Sodium: 138
pCO2, Ven: 47.4

## 2010-12-19 LAB — DIFFERENTIAL
Basophils Relative: 1
Lymphocytes Relative: 30
Monocytes Absolute: 0.5
Monocytes Relative: 7
Neutro Abs: 3.8
Neutrophils Relative %: 57

## 2010-12-19 LAB — CBC
Hemoglobin: 13.9
RBC: 5.1
WBC: 6.6

## 2010-12-19 LAB — POCT I-STAT CREATININE: Creatinine, Ser: 0.8

## 2010-12-21 LAB — DIFFERENTIAL
Basophils Absolute: 0
Basophils Relative: 1
Eosinophils Absolute: 0.2
Eosinophils Relative: 3
Monocytes Absolute: 0.6
Monocytes Relative: 8

## 2010-12-21 LAB — POCT CARDIAC MARKERS
Myoglobin, poc: 69.7
Myoglobin, poc: 78.6
Operator id: 4533

## 2010-12-21 LAB — BASIC METABOLIC PANEL
CO2: 26
Chloride: 101
GFR calc Af Amer: >60
Glucose, Bld: 124 — ABNORMAL HIGH
Sodium: 134 — ABNORMAL LOW

## 2010-12-21 LAB — CBC
HCT: 39
Hemoglobin: 13.3
MCHC: 34
MCV: 81.5
RBC: 4.79
RDW: 13.5

## 2010-12-22 ENCOUNTER — Encounter (HOSPITAL_COMMUNITY): Payer: Self-pay | Admitting: *Deleted

## 2010-12-22 ENCOUNTER — Emergency Department (HOSPITAL_COMMUNITY): Payer: Self-pay

## 2010-12-22 ENCOUNTER — Emergency Department (HOSPITAL_COMMUNITY)
Admission: EM | Admit: 2010-12-22 | Discharge: 2010-12-22 | Disposition: A | Discharge status: Home / Self Care | Payer: Self-pay | Attending: Emergency Medicine | Admitting: Emergency Medicine

## 2010-12-22 DIAGNOSIS — E119 Type 2 diabetes mellitus without complications: Secondary | ICD-10-CM | POA: Insufficient documentation

## 2010-12-22 DIAGNOSIS — S335XXA Sprain of ligaments of lumbar spine, initial encounter: Secondary | ICD-10-CM | POA: Insufficient documentation

## 2010-12-22 DIAGNOSIS — I10 Essential (primary) hypertension: Secondary | ICD-10-CM | POA: Insufficient documentation

## 2010-12-22 DIAGNOSIS — Z87891 Personal history of nicotine dependence: Secondary | ICD-10-CM | POA: Insufficient documentation

## 2010-12-22 DIAGNOSIS — S93402A Sprain of unspecified ligament of left ankle, initial encounter: Secondary | ICD-10-CM

## 2010-12-22 DIAGNOSIS — X500XXA Overexertion from strenuous movement or load, initial encounter: Secondary | ICD-10-CM | POA: Insufficient documentation

## 2010-12-22 DIAGNOSIS — S93409A Sprain of unspecified ligament of unspecified ankle, initial encounter: Secondary | ICD-10-CM | POA: Insufficient documentation

## 2010-12-22 MED ORDER — DIAZEPAM 5 MG PO TABS: 5.0000 mg | ORAL_TABLET | Freq: Three times a day (TID) | ORAL | Status: AC | PRN

## 2010-12-22 MED ORDER — DIAZEPAM 5 MG PO TABS
5.0000 mg | ORAL_TABLET | Freq: Once | ORAL | Status: AC
  Administered 2010-12-22: 5 mg via ORAL
  Filled 2010-12-22: qty 1

## 2010-12-22 MED ORDER — HYDROMORPHONE HCL 1 MG/ML IJ SOLN
1.0000 mg | Freq: Once | INTRAMUSCULAR | Status: AC
  Administered 2010-12-22: 1 mg via INTRAMUSCULAR
  Filled 2010-12-22: qty 1

## 2010-12-22 MED ORDER — OXYCODONE-ACETAMINOPHEN 5-325 MG PO TABS
2.0000 | ORAL_TABLET | Freq: Once | ORAL | Status: AC
  Administered 2010-12-22: 2 via ORAL
  Filled 2010-12-22: qty 2

## 2010-12-22 MED ORDER — OXYCODONE-ACETAMINOPHEN 5-325 MG PO TABS: 1.0000 | ORAL_TABLET | ORAL | Status: AC | PRN

## 2010-12-22 MED ORDER — IBUPROFEN 800 MG PO TABS
800.0000 mg | ORAL_TABLET | Freq: Once | ORAL | Status: AC
  Administered 2010-12-22: 800 mg via ORAL
  Filled 2010-12-22: qty 1

## 2010-12-22 NOTE — ED Notes (Signed)
C/o back pain , left ankle and foot pain, states he stepped in a hole yesterday

## 2010-12-22 NOTE — ED Provider Notes (Signed)
History     CSN: [id number] Arrival date & time: 12/22/2010  2:28 PM  Chief Complaint  Patient presents with  . Back Pain    (HPI Jose Short is a 40 y.o. male who presents to the ED for back pain.  Past Medical History  Diagnosis Date  . Hypertension   . Diabetes mellitus     Past Surgical History  Procedure Date  . Hernia repair     Family History  Problem Relation Age of Onset  . Diabetes Mother   . Hypertension Mother   . Diabetes Father   . Hypertension Father   . Stroke Other     History  Substance Use Topics  . Smoking status: Former Smoker -- 3 years    Types: Cigarettes    Quit date: 12/10/1988  . Smokeless tobacco: Current User    Types: Snuff  . Alcohol Use: No      Review of Systems  Allergies  Penicillins  Home Medications   Current Outpatient Rx  Name Route Sig Dispense Refill  . ASPIRIN EC 81 MG PO TBEC Oral Take 324 mg by mouth daily.      . OMEGA-3 FATTY ACIDS 1000 MG PO CAPS Oral Take 1 g by mouth daily.      Carma Leaven M PLUS PO TABS Oral Take 1 tablet by mouth daily.      Marland Kitchen POTASSIUM 99 MG PO TABS Oral Take 1 tablet by mouth daily.      Marland Kitchen HYDROCODONE-ACETAMINOPHEN 5-325 MG PO TABS Oral Take 1 tablet by mouth every 4 (four) hours as needed for pain. 20 tablet 0    BP 145/85  Pulse 75  Temp(Src) 98.2 F (36.8 C) (Oral)  Resp 20  Ht 6\' 2"  (1.88 m)  Wt 335 lb (151.955 kg)  BMI 43.01 kg/m2  SpO2 100%  Physical Exam  ED Course  Procedures (including critical care time)  Labs Reviewed - No data to display No results found.   No diagnosis found.    MDM  Care turned over to Burgess Amor, [facility]  [place], NP 12/22/10 [address] [place], [place] 12/23/10 [phone]

## 2010-12-23 NOTE — ED Provider Notes (Signed)
History     CSN: [id number] Arrival date & time: 12/22/2010  2:28 PM  Chief Complaint  Patient presents with  . Back Pain    (Consider location/radiation/quality/duration/timing/severity/associated sxs/prior treatment) Patient is a 40 y.o. male presenting with back pain and ankle pain. The history is provided by the patient.  Back Pain  This is a recurrent problem. The current episode started yesterday. The problem occurs constantly. The problem has been gradually worsening. Associated with: He stepped out of his work truck yesterday,  landing with his left foot into a hole,  causing trauma to his lower back and also an inversion injury to his left ankle. The pain is present in the lumbar spine. The quality of the pain is described as stabbing. The pain does not radiate. The pain is at a severity of 9/10. The pain is severe. The symptoms are aggravated by certain positions, twisting and bending. The pain is the same all the time. Pertinent negatives include no chest pain, no fever, no numbness, no headaches, no abdominal pain, no bowel incontinence, no perianal numbness, no paresthesias, no paresis, no tingling and no weakness. He has tried nothing for the symptoms.  Ankle Pain  The incident occurred yesterday. The incident occurred at work. Injury mechanism: Inversion injury. The pain is present in the left ankle. The quality of the pain is described as aching. The pain is at a severity of 4/10. The pain is moderate. The pain has been constant since onset. Pertinent negatives include no numbness, no muscle weakness, no loss of sensation and no tingling. He has tried ice for the symptoms. The treatment provided mild relief.    Past Medical History  Diagnosis Date  . Hypertension   . Diabetes mellitus     Past Surgical History  Procedure Date  . Hernia repair     Family History  Problem Relation Age of Onset  . Diabetes Mother   . Hypertension Mother   . Diabetes Father   .  Hypertension Father   . Stroke Other     History  Substance Use Topics  . Smoking status: Former Smoker -- 3 years    Types: Cigarettes    Quit date: 12/10/1988  . Smokeless tobacco: Current User    Types: Snuff  . Alcohol Use: No      Review of Systems  Constitutional: Negative for fever.  HENT: Negative for congestion, sore throat and neck pain.   Eyes: Negative.   Respiratory: Negative for chest tightness and shortness of breath.   Cardiovascular: Negative for chest pain.  Gastrointestinal: Negative for nausea, abdominal pain and bowel incontinence.  Genitourinary: Negative.   Musculoskeletal: Positive for back pain. Negative for joint swelling and arthralgias.  Skin: Negative.  Negative for rash and wound.  Neurological: Negative for dizziness, tingling, weakness, light-headedness, numbness, headaches and paresthesias.  Hematological: Negative.   Psychiatric/Behavioral: Negative.     Allergies  Penicillins  Home Medications   Current Outpatient Rx  Name Route Sig Dispense Refill  . ASPIRIN EC 81 MG PO TBEC Oral Take 324 mg by mouth daily.      . OMEGA-3 FATTY ACIDS 1000 MG PO CAPS Oral Take 1 g by mouth daily.      Carma Leaven M PLUS PO TABS Oral Take 1 tablet by mouth daily.      Marland Kitchen POTASSIUM 99 MG PO TABS Oral Take 1 tablet by mouth daily.      Marland Kitchen DIAZEPAM 5 MG PO TABS Oral Take 1 tablet (  5 mg total) by mouth every 8 (eight) hours as needed (muscle spasm). 15 tablet 0  . OXYCODONE-ACETAMINOPHEN 5-325 MG PO TABS Oral Take 1 tablet by mouth every 4 (four) hours as needed for pain. 15 tablet 0    BP 124/77  Pulse 65  Temp(Src) 98.2 F (36.8 C) (Oral)  Resp 20  Ht 6\' 2"  (1.88 m)  Wt 335 lb (151.955 kg)  BMI 43.01 kg/m2  SpO2 100%  Physical Exam  Nursing note and vitals reviewed. Constitutional: He is oriented to person, place, and time. He appears well-developed and well-nourished.  HENT:  Head: Normocephalic.  Eyes: Conjunctivae are normal.  Neck: Normal  range of motion. Neck supple.  Cardiovascular: Normal rate, regular rhythm and intact distal pulses.  Exam reveals no decreased pulses.   Pulses:      Dorsalis pedis pulses are 2+ on the right side, and 2+ on the left side.       Posterior tibial pulses are 2+ on the right side, and 2+ on the left side.       Pedal pulses normal.  Pulmonary/Chest: Effort normal. He has no wheezes.  Abdominal: Soft. Bowel sounds are normal. He exhibits no distension and no mass.  Musculoskeletal: Normal range of motion. He exhibits tenderness. He exhibits no edema.       Left ankle: He exhibits swelling and ecchymosis. He exhibits normal range of motion and no deformity. tenderness. Lateral malleolus and CF ligament tenderness found. No proximal fibula tenderness found. Achilles tendon normal.       Lumbar back: He exhibits tenderness. He exhibits no swelling, no edema and no spasm.  Neurological: He is alert and oriented to person, place, and time. He has normal strength. He displays no atrophy and no tremor. No cranial nerve deficit or sensory deficit. Gait normal.  Reflex Scores:      Patellar reflexes are 2+ on the right side and 2+ on the left side.      Achilles reflexes are 2+ on the right side and 2+ on the left side.      No strength deficit noted in hip and knee flexor and extensor muscle groups.  Ankle flexion and extension intact.  Skin: Skin is warm, dry and intact.  Psychiatric: He has a normal mood and affect.    ED Course  Procedures (including critical care time)  Labs Reviewed - No data to display Dg Lumbar Spine Complete  12/22/2010  *RADIOLOGY REPORT*  Clinical Data: Pain, fall  LUMBAR SPINE - COMPLETE 4+ VIEW  Comparison: 12/19/2009  Findings: Five non-rib bearing lumbar vertebrae. Minimal facet degenerative changes lower lumbar spine. Vertebral body and disc space heights maintained. No acute fracture, subluxation or bone destruction. No spondylolysis. Coils present from prior ventral  hernia repair. SI joints symmetric.  IMPRESSION: No acute lumbar spine abnormalities.  Original Report Authenticated By: Lollie Marrow, M.D.   Dg Ankle Complete Left  12/22/2010  *RADIOLOGY REPORT*  Clinical Data: Twisting injury to left ankle.  LEFT ANKLE COMPLETE - 3+ VIEW  Comparison: None.  Findings: No acute fracture or dislocation identified.  Corticated abnormality adjacent to the tip of the lateral malleolus represents either old injury or unfused apophysis.  There is a small amount of focal calcification adjacent to the medial malleolus which also does not have the appearance of acute injury.  Ankle mortise shows normal alignment.  IMPRESSION: No acute fracture identified.  Original Report Authenticated By: Reola Calkins, M.D.     1.  Lumbar sprain   2. Left ankle sprain       MDM  Crutches, aso, oxycodone,  Valium.  F/u pcp in 1 week if not improved.        Candis Musa, PA 12/23/10 [phone]

## 2010-12-26 NOTE — ED Provider Notes (Signed)
Medical screening examination/treatment/procedure(s) were performed by non-physician practitioner and as supervising physician I was immediately available for consultation/collaboration.  Hurman Horn, MD 12/26/10 2129

## 2010-12-26 NOTE — ED Provider Notes (Signed)
Medical screening examination/treatment/procedure(s) were performed by non-physician practitioner and as supervising physician I was immediately available for consultation/collaboration.  Vyron Fronczak M Charlottie Peragine, MD 12/26/10 2129 

## 2010-12-27 LAB — CBC
Hemoglobin: 13.7
MCHC: 33.3
Platelets: 359
RDW: 13.5

## 2010-12-27 LAB — I-STAT 8, (EC8 V) (CONVERTED LAB)
Acid-Base Excess: 3 — ABNORMAL HIGH
Chloride: 103
HCT: 43
Operator id: 235561
Potassium: 3.8
TCO2: 29
pH, Ven: 7.441 — ABNORMAL HIGH

## 2010-12-27 LAB — POCT I-STAT CREATININE
Creatinine, Ser: 0.9
Operator id: 235561

## 2010-12-27 LAB — DIFFERENTIAL
Basophils Absolute: 0
Basophils Relative: 1
Lymphocytes Relative: 26
Monocytes Absolute: 0.5
Neutro Abs: 4.1
Neutrophils Relative %: 61

## 2010-12-29 NOTE — ED Provider Notes (Signed)
Evaluation and management procedures were performed by the PA/NP under my supervision/collaboration.    Mohogany Toppins D Elbert Spickler, MD 12/29/10 1910 

## 2011-03-12 ENCOUNTER — Emergency Department (HOSPITAL_COMMUNITY): Payer: Self-pay

## 2011-03-12 ENCOUNTER — Encounter (HOSPITAL_COMMUNITY): Payer: Self-pay

## 2011-03-12 ENCOUNTER — Emergency Department (HOSPITAL_COMMUNITY)
Admission: EM | Admit: 2011-03-12 | Discharge: 2011-03-12 | Disposition: A | Discharge status: Home / Self Care | Payer: Self-pay | Attending: Emergency Medicine | Admitting: Emergency Medicine

## 2011-03-12 DIAGNOSIS — B9789 Other viral agents as the cause of diseases classified elsewhere: Secondary | ICD-10-CM | POA: Insufficient documentation

## 2011-03-12 DIAGNOSIS — R509 Fever, unspecified: Secondary | ICD-10-CM | POA: Insufficient documentation

## 2011-03-12 DIAGNOSIS — I10 Essential (primary) hypertension: Secondary | ICD-10-CM | POA: Insufficient documentation

## 2011-03-12 DIAGNOSIS — R1110 Vomiting, unspecified: Secondary | ICD-10-CM | POA: Insufficient documentation

## 2011-03-12 DIAGNOSIS — R197 Diarrhea, unspecified: Secondary | ICD-10-CM | POA: Insufficient documentation

## 2011-03-12 DIAGNOSIS — R739 Hyperglycemia, unspecified: Secondary | ICD-10-CM

## 2011-03-12 DIAGNOSIS — R05 Cough: Secondary | ICD-10-CM | POA: Insufficient documentation

## 2011-03-12 DIAGNOSIS — R112 Nausea with vomiting, unspecified: Secondary | ICD-10-CM | POA: Insufficient documentation

## 2011-03-12 DIAGNOSIS — Z7982 Long term (current) use of aspirin: Secondary | ICD-10-CM | POA: Insufficient documentation

## 2011-03-12 DIAGNOSIS — R059 Cough, unspecified: Secondary | ICD-10-CM | POA: Insufficient documentation

## 2011-03-12 DIAGNOSIS — E119 Type 2 diabetes mellitus without complications: Secondary | ICD-10-CM | POA: Insufficient documentation

## 2011-03-12 DIAGNOSIS — B349 Viral infection, unspecified: Secondary | ICD-10-CM

## 2011-03-12 DIAGNOSIS — IMO0001 Reserved for inherently not codable concepts without codable children: Secondary | ICD-10-CM | POA: Insufficient documentation

## 2011-03-12 NOTE — ED Notes (Signed)
Pt requesting work not on arrival to room. Pt states he missed three days from work last week. (Wed-Fri). Pt states productive cough at times,which was green in color this morning, body aches and vomiting earlier in the week. Denies vomiting today.

## 2011-03-12 NOTE — ED Notes (Signed)
Pt c/o cold symptoms, feeling achy, and vomited this morning.

## 2011-03-12 NOTE — ED Provider Notes (Signed)
History     CSN: [id number]  Arrival date & time 03/12/11  0729   First MD Initiated Contact with Patient 03/12/11 [phone]      Chief Complaint  Patient presents with  . URI    (Consider location/radiation/quality/duration/timing/severity/associated sxs/prior treatment) The history is provided by the patient.   The patient is a 40 year old, male, who complains of a nonproductive cough, and myalgias.  He states that he had similar symptoms approximately a week ago, and they resolved until today, when he had recurrence of the cough, and myalgias.  He denies fevers, chills, nausea, vomiting, or diarrhea. Past Medical History  Diagnosis Date  . Hypertension   . Diabetes mellitus     Past Surgical History  Procedure Date  . Hernia repair     Family History  Problem Relation Age of Onset  . Diabetes Mother   . Hypertension Mother   . Diabetes Father   . Hypertension Father   . Stroke Other     History  Substance Use Topics  . Smoking status: Former Smoker -- 3 years    Types: Cigarettes    Quit date: 12/10/1988  . Smokeless tobacco: Current User    Types: Snuff  . Alcohol Use: No      Review of Systems  Constitutional: Negative for fever and chills.  HENT: Negative for congestion.   Eyes: Negative for photophobia.  Respiratory: Positive for cough. Negative for chest tightness and shortness of breath.   Cardiovascular: Negative for chest pain.  Gastrointestinal: Negative for nausea, vomiting, abdominal pain and diarrhea.  Musculoskeletal: Positive for myalgias.  Skin: Negative for rash.  Neurological: Negative for headaches.  Psychiatric/Behavioral: Negative for confusion.    Allergies  Penicillins  Home Medications   Current Outpatient Rx  Name Route Sig Dispense Refill  . ASPIRIN EC 81 MG PO TBEC Oral Take 324 mg by mouth daily.      . OMEGA-3 FATTY ACIDS 1000 MG PO CAPS Oral Take 1 g by mouth daily.      Carma Leaven M PLUS PO TABS Oral Take 1 tablet by  mouth daily.      Marland Kitchen POTASSIUM 99 MG PO TABS Oral Take 1 tablet by mouth daily.        BP 166/104  Pulse 83  Temp(Src) 97.7 F (36.5 C) (Oral)  Resp 20  Ht 6\' 2"  (1.88 m)  Wt 335 lb (151.955 kg)  BMI 43.01 kg/m2  SpO2 96%  Physical Exam  Constitutional: He is oriented to person, place, and time. He appears well-developed and well-nourished. No distress.  HENT:  Head: Normocephalic and atraumatic.  Eyes: EOM are normal. Pupils are equal, round, and reactive to light.  Neck: Normal range of motion. Neck supple.  Cardiovascular: Normal rate, regular rhythm and normal heart sounds.   No murmur heard. Pulmonary/Chest: Effort normal and breath sounds normal. No respiratory distress. He has no wheezes. He has no rales.  Abdominal: Soft. Bowel sounds are normal. He exhibits no distension and no mass. There is no tenderness. There is no rebound and no guarding.  Musculoskeletal: Normal range of motion. He exhibits no edema and no tenderness.  Neurological: He is alert and oriented to person, place, and time. No cranial nerve deficit.  Skin: Skin is warm and dry. He is not diaphoretic.  Psychiatric: He has a normal mood and affect. His behavior is normal.    ED Course  Procedures (including critical care time)   Labs Reviewed  POCT CBG MONITORING  No results found.   No diagnosis found.    MDM  uri        Nicholes Stairs, MD 03/12/11 [phone]

## 2011-03-12 NOTE — ED Notes (Signed)
CBG of 278. Pt states he last checked it last week at home. PT states he has not taken his Metformin this morning but plans to take it when he gets home.

## 2011-03-12 NOTE — ED Provider Notes (Signed)
Scribed for Nicholes Stairs, MD, the patient was seen in room APA17/[facility]17 . This chart was scribed by Ellie Lunch.   CSN: [id number]  Arrival date & time 03/12/11  0729   First MD Initiated Contact with Patient 03/12/11 [phone]      Chief Complaint  Patient presents with  . URI    (Consider location/radiation/quality/duration/timing/severity/associated sxs/prior treatment) HPI Pt seen at 7:59 AM Jose Short is a 40 y.o. male who presents to the Emergency Department complaining of cough with associated post tussive emesis (1 time this am) and body aches. Symptoms began 5 days ago with fever, body aches, n/v/d. Pt reports symptoms improved gradually since onset, but this morning he began to feel ill again. Pt denies any current fever, nausea, or diarrhea. There are no other associated symptoms and no other alleviating or aggravating factors.    Past Medical History  Diagnosis Date  . Hypertension   . Diabetes mellitus     Past Surgical History  Procedure Date  . Hernia repair     Family History  Problem Relation Age of Onset  . Diabetes Mother   . Hypertension Mother   . Diabetes Father   . Hypertension Father   . Stroke Other     History  Substance Use Topics  . Smoking status: Former Smoker -- 3 years    Types: Cigarettes    Quit date: 12/10/1988  . Smokeless tobacco: Current User    Types: Snuff  . Alcohol Use: No      Review of Systems 10 Systems reviewed and are negative for acute change except as noted in the HPI.   Allergies  Penicillins  Home Medications   Current Outpatient Rx  Name Route Sig Dispense Refill  . ASPIRIN EC 81 MG PO TBEC Oral Take 324 mg by mouth daily.      . OMEGA-3 FATTY ACIDS 1000 MG PO CAPS Oral Take 1 g by mouth daily.      Carma Leaven M PLUS PO TABS Oral Take 1 tablet by mouth daily.      Marland Kitchen POTASSIUM 99 MG PO TABS Oral Take 1 tablet by mouth daily.        BP 166/104  Pulse 83  Temp(Src) 97.7 F (36.5 C) (Oral)   Resp 20  Ht 6\' 2"  (1.88 m)  Wt 335 lb (151.955 kg)  BMI 43.01 kg/m2  SpO2 96%  Physical Exam  Nursing note and vitals reviewed. Constitutional: He is oriented to person, place, and time. He appears well-developed and well-nourished. No distress.  HENT:  Head: Normocephalic and atraumatic.  Eyes: Conjunctivae are normal. No scleral icterus.  Neck: Normal range of motion. Neck supple.  Cardiovascular: Normal rate, regular rhythm and normal heart sounds.   Pulmonary/Chest: Effort normal. No respiratory distress.  Abdominal: Soft. Bowel sounds are normal. There is no tenderness.  Neurological: He is alert and oriented to person, place, and time.  Skin: Skin is warm and dry.  Psychiatric: He has a normal mood and affect.    ED Course  Procedures (including critical care time) DIAGNOSTIC STUDIES: Oxygen Saturation is 96% on room air, normal by my interpretation.    COORDINATION OF CARE:  Labs Reviewed  GLUCOSE, CAPILLARY - Abnormal; Notable for the following:    Glucose-Capillary 278 (*)    All other components within normal limits  POCT CBG MONITORING   Dg Chest 2 View  03/12/2011  *RADIOLOGY REPORT*  Clinical Data: Cough and congestion.  Question pneumonia.  CHEST - 2 VIEW  Comparison: [facility] chest 05/29/2010.  Findings: The heart size is normal.  The lungs are clear.  The visualized soft tissues and bony thorax are unremarkable.  IMPRESSION: Negative chest.  Original Report Authenticated By: Jamesetta Orleans. MATTERN, M.D.    8:05 AM EDP discussed with PT plan to get chest xray to rule out pneumonia and a CBG.   No diagnosis found.    MDM  Viral illness No signs toxicity or severe illness  I personally performed the services described in this documentation, which was scribed in my presence. The recorded information has been reviewed and considered.        Nicholes Stairs, MD 03/12/11 [phone]

## 2011-03-31 ENCOUNTER — Emergency Department (HOSPITAL_COMMUNITY)
Admission: EM | Admit: 2011-03-31 | Discharge: 2011-03-31 | Disposition: A | Discharge status: Home / Self Care | Payer: Self-pay | Attending: Emergency Medicine | Admitting: Emergency Medicine

## 2011-03-31 ENCOUNTER — Encounter (HOSPITAL_COMMUNITY): Payer: Self-pay | Admitting: *Deleted

## 2011-03-31 DIAGNOSIS — W010XXA Fall on same level from slipping, tripping and stumbling without subsequent striking against object, initial encounter: Secondary | ICD-10-CM | POA: Insufficient documentation

## 2011-03-31 DIAGNOSIS — M25569 Pain in unspecified knee: Secondary | ICD-10-CM | POA: Insufficient documentation

## 2011-03-31 DIAGNOSIS — E119 Type 2 diabetes mellitus without complications: Secondary | ICD-10-CM | POA: Insufficient documentation

## 2011-03-31 DIAGNOSIS — IMO0002 Reserved for concepts with insufficient information to code with codable children: Secondary | ICD-10-CM | POA: Insufficient documentation

## 2011-03-31 DIAGNOSIS — I10 Essential (primary) hypertension: Secondary | ICD-10-CM | POA: Insufficient documentation

## 2011-03-31 DIAGNOSIS — Z79899 Other long term (current) drug therapy: Secondary | ICD-10-CM | POA: Insufficient documentation

## 2011-03-31 DIAGNOSIS — S86911A Strain of unspecified muscle(s) and tendon(s) at lower leg level, right leg, initial encounter: Secondary | ICD-10-CM

## 2011-03-31 MED ORDER — HYDROCODONE-ACETAMINOPHEN 5-325 MG PO TABS: ORAL_TABLET | ORAL | Status: DC

## 2011-03-31 NOTE — ED Provider Notes (Signed)
Medical screening examination/treatment/procedure(s) were performed by non-physician practitioner and as supervising physician I was immediately available for consultation/collaboration.  Flint Melter, MD 03/31/11 [phone]

## 2011-03-31 NOTE — ED Notes (Signed)
Pt c/o pain in his right knee. Slipped on ice this am and twisted his right knee.

## 2011-03-31 NOTE — ED Provider Notes (Signed)
History     CSN: [id number]  Arrival date & time 03/31/11  1191   First MD Initiated Contact with Patient 03/31/11 [phone]      Chief Complaint  Patient presents with  . Knee Pain    (Consider location/radiation/quality/duration/timing/severity/associated sxs/prior treatment) Patient is a 41 y.o. male presenting with knee pain. The history is provided by the patient.  Knee Pain This is a new problem. The current episode started today. The problem occurs constantly. The problem has been gradually worsening. Associated symptoms include arthralgias. Pertinent negatives include no abdominal pain, chest pain, coughing or neck pain. The symptoms are aggravated by standing and walking. He has tried nothing for the symptoms. The treatment provided no relief.    Past Medical History  Diagnosis Date  . Hypertension   . Diabetes mellitus     Past Surgical History  Procedure Date  . Hernia repair     Family History  Problem Relation Age of Onset  . Diabetes Mother   . Hypertension Mother   . Diabetes Father   . Hypertension Father   . Stroke Other     History  Substance Use Topics  . Smoking status: Former Smoker -- 3 years    Types: Cigarettes    Quit date: 12/10/1988  . Smokeless tobacco: Current User    Types: Snuff  . Alcohol Use: No      Review of Systems  Constitutional: Negative for activity change.       All ROS Neg except as noted in HPI  HENT: Negative for nosebleeds and neck pain.   Eyes: Negative for photophobia and discharge.  Respiratory: Negative for cough, shortness of breath and wheezing.   Cardiovascular: Negative for chest pain and palpitations.  Gastrointestinal: Negative for abdominal pain and blood in stool.  Genitourinary: Negative for dysuria, frequency and hematuria.  Musculoskeletal: Positive for arthralgias. Negative for back pain.  Skin: Negative.   Neurological: Negative for dizziness, seizures and speech difficulty.    Psychiatric/Behavioral: Negative for hallucinations and confusion.    Allergies  Penicillins  Home Medications   Current Outpatient Rx  Name Route Sig Dispense Refill  . ASPIRIN EC 81 MG PO TBEC Oral Take 324 mg by mouth daily.      . OMEGA-3 FATTY ACIDS 1000 MG PO CAPS Oral Take 1 g by mouth daily.      Marland Kitchen HYDROCODONE-ACETAMINOPHEN 5-325 MG PO TABS  1 or 2 tablets every 4 hours as needed for pain 20 tablet 0  . THERA M PLUS PO TABS Oral Take 1 tablet by mouth daily.      Marland Kitchen POTASSIUM 99 MG PO TABS Oral Take 1 tablet by mouth daily.        BP 135/91  Pulse 77  Temp(Src) 97.9 F (36.6 C) (Oral)  Resp 20  Ht 6\' 2"  (1.88 m)  Wt 335 lb (151.955 kg)  BMI 43.01 kg/m2  SpO2 97%  Physical Exam  Nursing note and vitals reviewed. Constitutional: He is oriented to person, place, and time. He appears well-developed and well-nourished.  Non-toxic appearance.  HENT:  Head: Normocephalic.  Right Ear: Tympanic membrane and external ear normal.  Left Ear: Tympanic membrane and external ear normal.  Eyes: EOM and lids are normal. Pupils are equal, round, and reactive to light.  Neck: Normal range of motion. Neck supple. Carotid bruit is not present.  Cardiovascular: Normal rate, regular rhythm, normal heart sounds, intact distal pulses and normal pulses.   Pulmonary/Chest: Breath sounds normal. No  respiratory distress.  Abdominal: Soft. Bowel sounds are normal. There is no tenderness. There is no guarding.  Musculoskeletal: Normal range of motion.       There is pain to palpation of the anterior and posterior portion of the left knee. There is no effusion. There is no gross deformity. Patient is not cooperative for adequate ligament examination at this time. Distal pulses are symmetrical.  Lymphadenopathy:       Head (right side): No submandibular adenopathy present.       Head (left side): No submandibular adenopathy present.    He has no cervical adenopathy.  Neurological: He is alert  and oriented to person, place, and time. He has normal strength. No cranial nerve deficit or sensory deficit.  Skin: Skin is warm and dry.  Psychiatric: He has a normal mood and affect. His speech is normal.    ED Course  Procedures (including critical care time)  Labs Reviewed - No data to display No results found.   1. Strain of knee and leg, right       MDM  I have reviewed nursing notes, vital signs, and all appropriate lab and imaging results for this patient. Patient is fitted with a knee immobilizer. Prescription for Norco 5 mg given. Patient is to see the orthopedist for outpatient evaluation of his knee.       Kathie Dike, [place] 03/31/11 1630

## 2011-04-25 ENCOUNTER — Encounter (HOSPITAL_COMMUNITY): Payer: Self-pay | Admitting: *Deleted

## 2011-04-25 ENCOUNTER — Emergency Department (INDEPENDENT_AMBULATORY_CARE_PROVIDER_SITE_OTHER)
Admission: EM | Admit: 2011-04-25 | Discharge: 2011-04-25 | Disposition: A | Discharge status: Home / Self Care | Payer: Self-pay | Source: Home / Self Care | Attending: Emergency Medicine | Admitting: Emergency Medicine

## 2011-04-25 DIAGNOSIS — J069 Acute upper respiratory infection, unspecified: Secondary | ICD-10-CM

## 2011-04-25 HISTORY — DX: Hyperlipidemia, unspecified: E78.5

## 2011-04-25 MED ORDER — ALBUTEROL SULFATE HFA 108 (90 BASE) MCG/ACT IN AERS: 1.0000 | INHALATION_SPRAY | RESPIRATORY_TRACT | Status: DC | PRN

## 2011-04-25 MED ORDER — TRAMADOL HCL 50 MG PO TABS: 100.0000 mg | ORAL_TABLET | Freq: Three times a day (TID) | ORAL | Status: AC | PRN

## 2011-04-25 MED ORDER — GUAIFENESIN-CODEINE 100-10 MG/5ML PO SYRP: 10.0000 mL | ORAL_SOLUTION | Freq: Four times a day (QID) | ORAL | Status: AC | PRN

## 2011-04-25 MED ORDER — ONDANSETRON 8 MG PO TBDP: 8.0000 mg | ORAL_TABLET | Freq: Three times a day (TID) | ORAL | Status: AC | PRN

## 2011-04-25 NOTE — ED Provider Notes (Signed)
Chief Complaint  Patient presents with  . Emesis    History of Present Illness:  The patient has had a 2 three-day history of nasal congestion, rhinorrhea, and sneezing. He's had some bloody, yellow, green drainage. Today he also felt nauseated and vomited a couple times and has had slight and abdominal pain. He also has headache, generalized weakness, and cough productive yellow-green sputum. He does have some wheezing and some chest pain. His throat has been scratching. He's felt feverish and had chills.  Review of Systems:  Other than noted above, the patient denies any of the following symptoms. Systemic:  No fever, chills, sweats, fatigue, myalgias, headache, or anorexia. Eye:  No redness, pain or drainage. ENT:  No earache, nasal congestion, rhinorrhea, sinus pressure, or sore throat. Lungs:  No cough, sputum production, wheezing, shortness of breath. Or chest pain. GI:  No nausea, vomiting, abdominal pain or diarrhea. Skin:  No rash or itching.  PMFSH:  Past medical history, family history, social history, meds, and allergies were reviewed.  Physical Exam:   Vital signs:  BP 137/101  Pulse 75  Temp(Src) 98.3 F (36.8 C) (Oral)  SpO2 96% General:  Alert, in no distress. Eye:  No conjunctival injection or drainage. ENT:  TMs and canals were normal, without erythema or inflammation.  Nasal mucosa was clear and uncongested, without drainage.  Mucous membranes were moist.  Pharynx was clear, without exudate or drainage.  There were no oral ulcerations or lesions. Neck:  Supple, no adenopathy, tenderness or mass. Lungs:  No respiratory distress.  Lungs were clear to auscultation, without wheezes, rales or rhonchi.  Breath sounds were clear and equal bilaterally. Heart:  Regular rhythm, without gallops, murmers or rubs. Skin:  Clear, warm, and dry, without rash or lesions.  Radiology:  No results found.  Assessment:   Diagnoses that have been ruled out:  None  Diagnoses that are  still under consideration:  None  Final diagnoses:  Upper respiratory infection      Plan:   1.  The following meds were prescribed:   New Prescriptions   ALBUTEROL (PROVENTIL HFA;VENTOLIN HFA) 108 (90 BASE) MCG/ACT INHALER    Inhale 1-2 puffs into the lungs every 4 (four) hours as needed for wheezing.   GUAIFENESIN-CODEINE (GUIATUSS AC) 100-10 MG/5ML SYRUP    Take 10 mLs by mouth 4 (four) times daily as needed for cough.   ONDANSETRON (ZOFRAN ODT) 8 MG DISINTEGRATING TABLET    Take 1 tablet (8 mg total) by mouth every 8 (eight) hours as needed for nausea.   TRAMADOL (ULTRAM) 50 MG TABLET    Take 2 tablets (100 mg total) by mouth every 8 (eight) hours as needed for pain.   2.  The patient was instructed in symptomatic care and handouts were given. 3.  The patient was told to return if becoming worse in any way, if no better in 3 or 4 days, and given some red flag symptoms that would indicate earlier return.   Roque Lias, MD 04/25/11 [phone]

## 2011-04-25 NOTE — Discharge Instructions (Signed)
Most upper respiratory infections are caused by viruses and do not require antibiotics.  We try to save the antibiotics for when we really need them to avoid resistance.  This does not mean that there is nothing that can be done.  Here are a few hints about things that can be done at home to get over an upper respiratory infection quicker:  Get extra sleep and extra fluids.  Get 7 to 9 hours of sleep per night and 6 to 8 glasses of water a day.  Getting extra sleep keeps the immune system from getting run down.  Most people with an upper respiratory infection are a little dehydrated.  The extra fluids also keep the secretions liquified and easier to deal with.  Also, get extra vitamin C.  4000 mg per day is the recommended dose. For the aches, headache, and fever, acetaminophen or ibuprofen are helpful.  These can be alternated every 4 hours.  People with liver disease should avoid large amounts of acetaminophen, and people with ulcer disease, gastroesophageal reflux, gastritis, congestive heart failure, chronic kidney disease, coronary artery disease and the elderly should avoid ibuprofen. For nasal congestion try Mucinex-D, or if you're having lots of sneezing or copious clear nasal drainage Allegra-D-24 hour.  A Saline nasal spray such as Ocean Spray can also help as can decongestant sprays such as Afrin, but you should not use the decongestant sprays for more than 3 or 4 days since they can be habituating.  If nasal dryness is a problem, Ayr Nasal Gel can help moisturize your nasal passages.  Breath Rite nasal strips can also offer a non-drug alternative treatment to nasal congestion, especially at night. For people with symptoms of sinusitis, sleeping with your head elevated can be helpful.  For sinus pain, moist, hot compresses to the face may provide some relief.  Many people find that inhaling steam as in a shower or from a pot of steaming water can help. For sore throat, zinc containing lozenges such  as Cold-Eze or Zicam are helpful.  Zinc helps to fight infection and has a mild astringent effect that relieves the sore, achey throat.  Hot salt water gargles (8 oz of hot water, 1/2 tsp of table salt, and a pinch of baking soda) can give relief as well as hot beverages such as hot tea. For the cough, old time remedies such as honey or honey and lemon are tried and true.  Over the counter cough syrups such as Delsym 2 tsp every 12 hours can help as well.  It's important when you have an upper respiratory infection not to pass the infection to others.  This involves being very careful about the following:  Frequent hand washing or use of hand sanitizer, especially after coughing, sneezing, blowing your nose or touching your face, nose or eyes. Do not shake hands or touch anyone and try to avoid touching surfaces that other people use such as doorknobs, shopping carts, telephones and computer keyboards. Use tissues and dispose of them properly in a garbage can or ziplock bag. Cough into your sleeve. Do not let others eat or drink after you.  It's also important to recognize the signs of serious illness and get evaluated if they occur: Any respiratory infection that lasts more than 7 to 10 days.  Yellow nasal drainage and sputum are not reliable indicators of a bacterial infection, but if they last for more than 1 week, see your doctor. Fever and sore throat can indicate strep. Fever   and cough can indicate influenza or pneumonia. Any kind of severe symptom such as difficulty breathing, intractable vomiting, or severe pain should prompt you to see a doctor as soon as possible.   Your body's immune system is really the thing that will get rid of this infection.  Your immune system is comprised of 2 types of specialized cells called T cells and B cells.  T cells coordinate the array of cells in your body that engulf invading bacteria or viruses while B cells orchestrate the production of antibodies that  neutralize infection.  Anything we do or any medications we give you, will just strengthen your immune system or help it clear up the infection quicker.  Here are a few helpful hints to improve your immune system to help overcome this illness or to prevent future infections:  A few vitamins can improve the health of your immune system.  That's why your diet should include plenty of fruits, vegetables, fish, nuts, and whole grains.  Vitamin A and bet-carotene can increase the cells that fight infections (T cells and B cells).  Vitamin A is abundant in dark greens and orange vegetables such as spinach, greens, sweet potatoes, and carrots.  Vitamin B6 contributes to the maturation of white blood cells, the cells that fight disease.  Foods with vitamin B6 include cold cereal and bananas.  Vitamin C is credited with preventing colds because it increases white blood cells and also prevents cellular damage.  Citrus fruits, peaches and green and red bell peppers are all hight in vitamin C.  Vitamin E is an anti-oxidant that encourages the production of natural killer cells which reject foreign invaders and B cells that produce antibodies.  Foods high in vitamin E include wheat germ, nuts and seeds.  Foods high in omega-3 fatty acids found in foods like salmon, tuna and mackerel boost your immune system and help cells to engulf and absorb germs.  Probiotics are good bacteria that increase your T cells.  These can be found in yogurt and are available in supplements such as Culturelle or Align.  Moderate exercise increases the strength of your immune system and your ability to recover from illness.  I suggest 3 to 5 moderate intensity 30 minute workouts per week.    Sleep is another component of maintaining a strong immune system.  It enables your body to recuperate from the day's activities, stress and work.  My recommendation is to get between 7 and 9 hours of sleep per night.  If you smoke, try to quit  completely or at least cut down.  Drink alcohol only in moderation if at all.  No more than 2 drinks daily for men or 1 for women.  Get a flu vaccine early in the fall or if you have not gotten one yet, once this illness has run its course.  If you are over 65, a smoker, or an asthmatic, get a pneumococcal vaccine.  My final recommendation is to maintain a healthy weight.  Excess weight can impair the immune system by interfering with the way the immune system deals with invading viruses or bacteria.   Just a few good health habits can help you add years to your life and enjoy your life more.  Here are my top 10.    1.  Do not use tobacco at all. If you need help quitting, discuss this with your physician.  The same thing applies to all recreational drugs.  If you drink, drink only in   moderation.  This means no more than 2 drinks per day for men or 1 drink per day for women.  2.  Keep your weight under control.  The Body Mass Index (BMI) is one measure of a person's weight compared to height.  It can be calculated by the following formula:  Weight in pounds x 706/(height in inches)x(height in inches).  Or you can ask your health care provider to figure it out for you.  A BMI of 20 - 25 is considered ideal.  25 - 30 is overweight, and over 30 is obese.  If you are overweight, discuss healthy weight loss plans with your physician.  3.  Get 30 minutes of moderate intensity exercise (such as walking) at least 5 days a week.  4.  If you are a male and do not wish to become pregnant, use a birth control method.  You may want to discuss this with your physician.  Both males and females should use protection against STDs (such as condoms) unless in a committed, mutually monogamous, long term relationship.  5.  Eat a good diet.  Try to get at least 5 servings of fruits and vegetables per day.  Avoid excessive salt.  For most people, this means less than 2300 mg of salt per day (1 tsp).  For people at  higher risk (strong family history, personal history of hypertension, African-American ethnicity) less than 1500 mg of salt per day is recommended. Avoid sugar sweetened beverages such as sweetened tea, sodas, and juices.  Avoid eating out or fast food more than twice a week.  Avoid fried food as much as possible.  6.  Get your suggested screenings.  Women who are sexually active should get a yearly Pap smear and chlamydia screening.  Women over 40 should get a mammogram every other year and over fifty every year.  Men and women over 50 should get a colonoscopy every 10 years.  Women over 65 should get a bone density scan.  I also suggest screening for HIV and hepatitis C.  7.  Get blood pressure checked as often as possible.  Yearly screening for cholesterol level and blood glucose is also recommened.  8.  Have a primary care doctor and get an annual check up.  If you don't have one, you can call [phone] for a referral.  9.  Get your recommended immunizations.  For adults, I recommend the following:  A yearly flu vaccine in the fall, a vaccine for tetanus, diphtheria, and pertussis (whooping cough) or Tdap once every 10 years, a pneumonia vaccine at age 65, and a shingles vaccine at age 60.  Many of these can be gotten at the health department or at local pharmacies.  10.  Get sufficient sleep.  7 to 9 hours per night is the recommended amount.    

## 2011-04-25 NOTE — ED Notes (Signed)
Cold and sinus pressure with productive cough  the last 2 days.  He woke this am at 0330 with N and V.  3-4 times  He also c/o  Headache.  He has had nothing to eat or drink this morning.  He checked his temp and it was 99.3

## 2011-05-14 ENCOUNTER — Emergency Department (HOSPITAL_COMMUNITY): Payer: Self-pay

## 2011-05-14 ENCOUNTER — Encounter (HOSPITAL_COMMUNITY): Payer: Self-pay | Admitting: *Deleted

## 2011-05-14 ENCOUNTER — Emergency Department (HOSPITAL_COMMUNITY)
Admission: EM | Admit: 2011-05-14 | Discharge: 2011-05-15 | Disposition: A | Discharge status: Home / Self Care | Payer: Self-pay | Attending: Emergency Medicine | Admitting: Emergency Medicine

## 2011-05-14 DIAGNOSIS — E785 Hyperlipidemia, unspecified: Secondary | ICD-10-CM | POA: Insufficient documentation

## 2011-05-14 DIAGNOSIS — R51 Headache: Secondary | ICD-10-CM | POA: Insufficient documentation

## 2011-05-14 DIAGNOSIS — E119 Type 2 diabetes mellitus without complications: Secondary | ICD-10-CM | POA: Insufficient documentation

## 2011-05-14 DIAGNOSIS — Z87891 Personal history of nicotine dependence: Secondary | ICD-10-CM | POA: Insufficient documentation

## 2011-05-14 DIAGNOSIS — I10 Essential (primary) hypertension: Secondary | ICD-10-CM | POA: Insufficient documentation

## 2011-05-14 DIAGNOSIS — Z7982 Long term (current) use of aspirin: Secondary | ICD-10-CM | POA: Insufficient documentation

## 2011-05-14 MED ORDER — MORPHINE SULFATE 10 MG/ML IJ SOLN
10.0000 mg | Freq: Once | INTRAMUSCULAR | Status: AC
  Administered 2011-05-14: 10 mg via INTRAMUSCULAR
  Filled 2011-05-14: qty 1

## 2011-05-14 MED ORDER — PROMETHAZINE HCL 25 MG/ML IJ SOLN
25.0000 mg | Freq: Once | INTRAMUSCULAR | Status: AC
  Administered 2011-05-14: 25 mg via INTRAMUSCULAR
  Filled 2011-05-14: qty 1

## 2011-05-14 MED ORDER — HYDROMORPHONE HCL PF 2 MG/ML IJ SOLN
2.0000 mg | Freq: Once | INTRAMUSCULAR | Status: AC
  Administered 2011-05-14: 2 mg via INTRAMUSCULAR
  Filled 2011-05-14: qty 1

## 2011-05-14 NOTE — ED Provider Notes (Signed)
History   This chart was scribed for Jose Lyons, MD by Jose Short. The patient was seen in room APAH2/APAH2 and the patient's care was started at 10:17PM.    CSN: [id number]  Arrival date & time 05/14/11  2124   First MD Initiated Contact with Patient 05/14/11 2215      Chief Complaint  Patient presents with  . Migraine    (Consider location/radiation/quality/duration/timing/severity/associated sxs/prior treatment) HPI  Jose Short is a 41 y.o. male who presents to the Emergency Department complaining of moderate, constant headache onset two days with associated symptoms of nausea, photophobia. Pt states "headache is unlike any headache he has had before, this pain is really bad." Modifying factors include taking equate migraine, with no relief. Pt has a hx of headaches and has been seen in the emergency room for headaches in the past.  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  PCP is Dr. Phillips Odor.   Past Medical History  Diagnosis Date  . Hypertension   . Diabetes mellitus   . Hyperlipidemia     Past Surgical History  Procedure Date  . Hernia repair     Family History  Problem Relation Age of Onset  . Diabetes Mother   . Hypertension Mother   . Diabetes Father   . Hypertension Father   . Stroke Other     History  Substance Use Topics  . Smoking status: Former Smoker -- 3 years    Types: Cigarettes    Quit date: 12/10/1988  . Smokeless tobacco: Current User    Types: Snuff  . Alcohol Use: Yes     occasionally      Review of Systems  All other systems reviewed and are negative.    10 Systems reviewed and are negative for acute change except as noted in the HPI.   Allergies  Penicillins  Home Medications   Current Outpatient Rx  Name Route Sig Dispense Refill  . ALBUTEROL SULFATE HFA 108 (90 BASE) MCG/ACT IN AERS Inhalation Inhale 1-2 puffs into the lungs every 4 (four)  hours as needed for wheezing. 1 Inhaler 0  . ASPIRIN EC 81 MG PO TBEC Oral Take 324 mg by mouth daily.      . OMEGA-3 FATTY ACIDS 1000 MG PO CAPS Oral Take 1 g by mouth daily.      Marland Kitchen HYDROCODONE-ACETAMINOPHEN 5-325 MG PO TABS  1 or 2 tablets every 4 hours as needed for pain 20 tablet 0  . METFORMIN HCL 500 MG PO TABS Oral Take 500 mg by mouth once.    Carma Leaven M PLUS PO TABS Oral Take 1 tablet by mouth daily.      Marland Kitchen POTASSIUM 99 MG PO TABS Oral Take 1 tablet by mouth daily.      Marland Kitchen PRAVASTATIN SODIUM 10 MG PO TABS Oral Take 10 mg by mouth daily.    . QUINAPRIL HCL 10 MG PO TABS Oral Take 10 mg by mouth at bedtime.      BP 136/83  Pulse 77  Temp(Src) 98.1 F (36.7 C) (Oral)  Resp 20  Ht 6\' 2"  (1.88 m)  Wt 321 lb (145.605 kg)  BMI 41.21 kg/m2  SpO2 97%  Physical Exam  Nursing note and vitals reviewed. Constitutional: He is oriented to person, place, and time. He appears well-developed and well-nourished.  HENT:  Head: Normocephalic and atraumatic.  Right Ear: External ear normal.  Left Ear: External ear  normal.  Nose: Nose normal.  Eyes: Conjunctivae and EOM are normal. Pupils are equal, round, and reactive to light. No scleral icterus.       No papal edema.   Neck: Normal range of motion. Neck supple. No thyromegaly present.  Cardiovascular: Normal rate and regular rhythm.  Exam reveals no gallop and no friction rub.   No murmur heard. Pulmonary/Chest: Effort normal. No stridor. He has no wheezes. He has no rales. He exhibits no tenderness.  Abdominal: He exhibits no distension. There is no tenderness. There is no rebound.  Musculoskeletal: Normal range of motion. He exhibits no edema.  Lymphadenopathy:    He has no cervical adenopathy.  Neurological: He is alert and oriented to person, place, and time. He has normal strength. Coordination normal.       Strength 5/5.   Skin: Skin is warm and dry. No rash noted. No erythema.  Psychiatric: He has a normal mood and affect. His  behavior is normal.    ED Course  Procedures (including critical care time)  DIAGNOSTIC STUDIES: Oxygen Saturation is 97% on room air, normal by my interpretation.    COORDINATION OF CARE:     Labs Reviewed - No data to display No results found.   No diagnosis found.   10:22PM- EDP at bedside discusses treatment plan.  MDM  The patient had no relief with 10 mg of morphine.  He was given dilaudid and head ct was performed which was okay.  Will discharge with a limited quantity of percocet.      I personally performed the services described in this documentation, which was scribed in my presence. The recorded information has been reviewed and considered.     Jose Lyons, MD 05/15/11 [phone]

## 2011-05-14 NOTE — ED Notes (Signed)
MD at bedside. 

## 2011-05-14 NOTE — ED Notes (Signed)
Headache for 2 days, states he has had headaches in the past but not this bad

## 2011-05-15 MED ORDER — OXYCODONE-ACETAMINOPHEN 5-325 MG PO TABS: 1.0000 | ORAL_TABLET | Freq: Four times a day (QID) | ORAL | Status: AC | PRN

## 2011-05-15 NOTE — ED Notes (Signed)
Discharge instructions reviewed with pt, questions answered. Pt verbalized understanding.  

## 2011-05-15 NOTE — Discharge Instructions (Signed)

## 2011-08-14 ENCOUNTER — Emergency Department (HOSPITAL_COMMUNITY)
Admission: EM | Admit: 2011-08-14 | Discharge: 2011-08-14 | Disposition: A | Discharge status: Home / Self Care | Payer: Self-pay | Attending: Emergency Medicine | Admitting: Emergency Medicine

## 2011-08-14 ENCOUNTER — Encounter (HOSPITAL_COMMUNITY): Payer: Self-pay | Admitting: *Deleted

## 2011-08-14 ENCOUNTER — Emergency Department (HOSPITAL_COMMUNITY): Payer: Self-pay

## 2011-08-14 DIAGNOSIS — K439 Ventral hernia without obstruction or gangrene: Secondary | ICD-10-CM | POA: Insufficient documentation

## 2011-08-14 DIAGNOSIS — E119 Type 2 diabetes mellitus without complications: Secondary | ICD-10-CM | POA: Insufficient documentation

## 2011-08-14 DIAGNOSIS — R10813 Right lower quadrant abdominal tenderness: Secondary | ICD-10-CM | POA: Insufficient documentation

## 2011-08-14 DIAGNOSIS — K5730 Diverticulosis of large intestine without perforation or abscess without bleeding: Secondary | ICD-10-CM | POA: Insufficient documentation

## 2011-08-14 DIAGNOSIS — R109 Unspecified abdominal pain: Secondary | ICD-10-CM | POA: Insufficient documentation

## 2011-08-14 DIAGNOSIS — I10 Essential (primary) hypertension: Secondary | ICD-10-CM | POA: Insufficient documentation

## 2011-08-14 LAB — URINALYSIS, ROUTINE W REFLEX MICROSCOPIC
Bilirubin Urine: NEGATIVE
Hgb urine dipstick: NEGATIVE
Nitrite: NEGATIVE
Protein, ur: NEGATIVE mg/dL
Specific Gravity, Urine: 1.02 (ref 1.005–1.030)
Urobilinogen, UA: 0.2 mg/dL (ref 0.0–1.0)

## 2011-08-14 LAB — CBC
HCT: 43.2 % (ref 39.0–52.0)
Hemoglobin: 14.2 g/dL (ref 13.0–17.0)
MCH: 27.1 pg (ref 26.0–34.0)
MCV: 82.4 fL (ref 78.0–100.0)
RBC: 5.24 MIL/uL (ref 4.22–5.81)
WBC: 5.6 10*3/uL (ref 4.0–10.5)

## 2011-08-14 LAB — DIFFERENTIAL
Eosinophils Absolute: 0.2 10*3/uL (ref 0.0–0.7)
Eosinophils Relative: 4 % (ref 0–5)
Lymphocytes Relative: 37 % (ref 12–46)
Lymphs Abs: 2 10*3/uL (ref 0.7–4.0)
Monocytes Absolute: 0.4 10*3/uL (ref 0.1–1.0)
Monocytes Relative: 6 % (ref 3–12)

## 2011-08-14 LAB — COMPREHENSIVE METABOLIC PANEL
ALT: 54 U/L — ABNORMAL HIGH (ref 0–53)
Alkaline Phosphatase: 79 U/L (ref 39–117)
BUN: 7 mg/dL (ref 6–23)
CO2: 28 mEq/L (ref 19–32)
Calcium: 9.6 mg/dL (ref 8.4–10.5)
GFR calc Af Amer: >90 mL/min (ref >90)
GFR calc non Af Amer: >90 mL/min (ref >90)
Glucose, Bld: 292 mg/dL — ABNORMAL HIGH (ref 70–99)
Total Protein: 7.4 g/dL (ref 6.0–8.3)

## 2011-08-14 LAB — URINE MICROSCOPIC-ADD ON

## 2011-08-14 LAB — LIPASE, BLOOD: Lipase: 26 U/L (ref 11–59)

## 2011-08-14 MED ORDER — HYDROCODONE-ACETAMINOPHEN 5-325 MG PO TABS: 2.0000 | ORAL_TABLET | ORAL | Status: AC | PRN

## 2011-08-14 MED ORDER — OXYCODONE-ACETAMINOPHEN 5-325 MG PO TABS
2.0000 | ORAL_TABLET | Freq: Once | ORAL | Status: AC
  Administered 2011-08-14: 2 via ORAL
  Filled 2011-08-14: qty 2

## 2011-08-14 MED ORDER — ONDANSETRON HCL 4 MG PO TABS: 4.0000 mg | ORAL_TABLET | Freq: Four times a day (QID) | ORAL | Status: AC

## 2011-08-14 MED ORDER — ONDANSETRON HCL 4 MG/2ML IJ SOLN
4.0000 mg | Freq: Once | INTRAMUSCULAR | Status: AC
  Administered 2011-08-14: 4 mg via INTRAVENOUS
  Filled 2011-08-14: qty 2

## 2011-08-14 MED ORDER — MORPHINE SULFATE 4 MG/ML IJ SOLN
4.0000 mg | Freq: Once | INTRAMUSCULAR | Status: AC
  Administered 2011-08-14: 4 mg via INTRAVENOUS
  Filled 2011-08-14: qty 1

## 2011-08-14 MED ORDER — SODIUM CHLORIDE 0.9 % IV BOLUS (SEPSIS)
1000.0000 mL | Freq: Once | INTRAVENOUS | Status: AC
  Administered 2011-08-14: 1000 mL via INTRAVENOUS

## 2011-08-14 MED ORDER — IOHEXOL 300 MG/ML  SOLN
100.0000 mL | Freq: Once | INTRAMUSCULAR | Status: AC | PRN
  Administered 2011-08-14: 100 mL via INTRAVENOUS

## 2011-08-14 NOTE — ED Notes (Signed)
Lower abd pain with n/v/d since last night.

## 2011-08-14 NOTE — Discharge Instructions (Signed)
Hernia Your testing today did not show any acute surgical problem. Your appendix is normal. Followup with Dr. Phillips Odor this week. Return to the ED for new or worsening symptoms. A hernia happens when an organ inside your body pushes out through a weak spot in your belly (abdominal) wall. Most hernias get worse over time. They can often be pushed back into place (reduced). Surgery may be needed to repair hernias that cannot be pushed into place. HOME CARE  Keep doing normal activities.   Avoid lifting more than 10 pounds (4.5 kilograms).   Cough gently and avoid straining. Over time, these things will:   Increase your hernia size.   Irritate your hernia.   Break down hernia repairs.   Stop smoking.   Do not wear anything tight over your hernia. Do not keep the hernia in with an outside bandage.   Eat food that is high in fiber (fruit, vegetables, whole grains).   Drink enough fluids to keep your pee (urine) clear or pale yellow.   Take medicines to make your poop soft (stool softeners) if you cannot poop (constipated).  GET HELP RIGHT AWAY IF:   You have a fever.   You have belly pain that gets worse.   You feel sick to your stomach (nauseous) and throw up (vomit).   Your skin starts to bulge out.   Your hernia turns a different color, feels hard, or is tender.   You have increased pain or puffiness (swelling) around the hernia.   You poop more or less often.   Your poop does not look the way normally does.   You have watery poop (diarrhea).   You cannot push the hernia back in place by applying gentle pressure while lying down.  MAKE SURE YOU:   Understand these instructions.   Will watch your condition.   Will get help right away if you are not doing well or get worse.  Document Released: 08/16/2009 Document Revised: 02/15/2011 Document Reviewed: 08/16/2009 [facility] Patient Information 2012 [place], [place]. Abdominal Pain Abdominal pain can be caused by many  things. Your caregiver decides the seriousness of your pain by an examination and possibly blood tests and X-rays. Many cases can be observed and treated at home. Most abdominal pain is not caused by a disease and will probably improve without treatment. However, in many cases, more time must pass before a clear cause of the pain can be found. Before that point, it may not be known if you need more testing, or if hospitalization or surgery is needed. HOME CARE INSTRUCTIONS   Do not take laxatives unless directed by your caregiver.   Take pain medicine only as directed by your caregiver.   Only take over-the-counter or prescription medicines for pain, discomfort, or fever as directed by your caregiver.   Try a clear liquid diet (broth, tea, or water) for as long as directed by your caregiver. Slowly move to a bland diet as tolerated.  SEEK IMMEDIATE MEDICAL CARE IF:   The pain does not go away.   You have a fever.   You keep throwing up (vomiting).   The pain is felt only in portions of the abdomen. Pain in the right side could possibly be appendicitis. In an adult, pain in the left lower portion of the abdomen could be colitis or diverticulitis.   You pass bloody or black tarry stools.  MAKE SURE YOU:   Understand these instructions.   Will watch your condition.   Will  get help right away if you are not doing well or get worse.  Document Released: 12/06/2004 Document Revised: 02/15/2011 Document Reviewed: 10/15/2007 [facility] Patient Information 2012 [place], [place].

## 2011-08-14 NOTE — ED Notes (Signed)
Pt is aware of a urine sample.  

## 2011-08-14 NOTE — ED Provider Notes (Signed)
History  This chart was scribed for Glynn Octave, MD by Bennett Scrape. This patient was seen in room APA11/APA11 and the patient's care was started at 5:21PM.  CSN: [id number]  Arrival date & time 08/14/11  1711   First MD Initiated Contact with Patient 08/14/11 1721      Chief Complaint  Patient presents with  . Abdominal Pain    The history is provided by the patient. No language interpreter was used.    Jose Short is a 41 y.o. male who presents to the Emergency Department complaining of gradual onset, gradually worsening, constant RLQ abdominal pain described as a cramping sensation that radiates into the right inguinal area with associated emesis and diarrhea that started last night. He states that he was having intermittent fevers off and on last night but this has resolved. He states that the pain does not involve the testicles. He reports 5 episodes of non-bloody emesis and non-bloody diarrhea described as watery today. He reports that pressure, such as the band of his pants, makes the pain worse. He reports two prior episodes of similar pain that he attributes to hernias. He denies strenuous or pulling activity. He denies back pain, chest pain, and hematochezia as associated symptoms He denies having a h/o kidney stones. He has a h/o HTN, DM and HLD. He is a former smoker but denies alcohol use.  PCP is Dr. Genia Hotter GI is Dr. Leticia Penna.   Past Medical History  Diagnosis Date  . Hypertension   . Diabetes mellitus   . Hyperlipidemia     Past Surgical History  Procedure Date  . Hernia repair     Family History  Problem Relation Age of Onset  . Diabetes Mother   . Hypertension Mother   . Diabetes Father   . Hypertension Father   . Stroke Other     History  Substance Use Topics  . Smoking status: Former Smoker -- 3 years    Types: Cigarettes    Quit date: 12/10/1988  . Smokeless tobacco: Current User    Types: Snuff  . Alcohol Use: No      Review of  Systems  A complete 10 system review of systems was obtained and all systems are negative except as noted in the HPI and PMH.   Allergies  Penicillins  Home Medications   Current Outpatient Rx  Name Route Sig Dispense Refill  . ACETAMINOPHEN 500 MG PO TABS Oral Take 1,000 mg by mouth as needed. For pain    . ASPIRIN EC 81 MG PO TBEC Oral Take 324 mg by mouth daily.      . OMEGA-3 FATTY ACIDS 1000 MG PO CAPS Oral Take 1 g by mouth daily.      Marland Kitchen METFORMIN HCL 500 MG PO TABS Oral Take 500 mg by mouth daily.     Carma Leaven M PLUS PO TABS Oral Take 1 tablet by mouth daily.      Marland Kitchen POTASSIUM 99 MG PO TABS Oral Take 1 tablet by mouth daily.      Marland Kitchen PRAVASTATIN SODIUM 10 MG PO TABS Oral Take 10 mg by mouth daily.    . QUINAPRIL HCL 10 MG PO TABS Oral Take 10 mg by mouth daily.     Marland Kitchen HYDROCODONE-ACETAMINOPHEN 5-325 MG PO TABS Oral Take 2 tablets by mouth every 4 (four) hours as needed for pain. 10 tablet 0  . ONDANSETRON HCL 4 MG PO TABS Oral Take 1 tablet (4 mg total) by mouth  every 6 (six) hours. 12 tablet 0    Triage Vitals: BP 141/80  Pulse 65  Temp(Src) 97.9 F (36.6 C) (Oral)  Resp 20  Ht 6\' 2"  (1.88 m)  Wt 294 lb (133.358 kg)  BMI 37.75 kg/m2  SpO2 95%  Physical Exam  Nursing note and vitals reviewed. Constitutional: He is oriented to person, place, and time. He appears well-developed and well-nourished. No distress.       obese  HENT:  Head: Normocephalic and atraumatic.  Eyes: Conjunctivae and EOM are normal.  Neck: Neck supple. No tracheal deviation present.  Cardiovascular: Normal rate and regular rhythm.   Pulmonary/Chest: Effort normal and breath sounds normal. No respiratory distress.  Abdominal: Soft. There is tenderness (RLQ). There is guarding.       No CVA tenderness  Genitourinary:       Right inguinal tenderness, no appreciable hernias, no testicular pain  Musculoskeletal: Normal range of motion.  Neurological: He is alert and oriented to person, place, and time.   Skin: Skin is warm and dry.  Psychiatric: He has a normal mood and affect. His behavior is normal.    ED Course  Procedures (including critical care time)  DIAGNOSTIC STUDIES: Oxygen Saturation is 95% on room air, adequate by my interpretation.    COORDINATION OF CARE: 5:34PM-Discussed treatment plan which includes CT scan of abdomen, IV fluids, blood work, and pain medications with pt and pt agreed to plan. 7:04PM-I attempted to recheck pt but was told by family that he was with radiology getting the CT scan. I will recheck later after he has returned. 7:37PM-Informed pt of radiology results. Discusssed discharge plan with pt and pt agreed to plan.  Labs Reviewed  COMPREHENSIVE METABOLIC PANEL - Abnormal; Notable for the following:    Glucose, Bld 292 (*)    Albumin 3.3 (*)    ALT 54 (*)    Total Bilirubin 0.2 (*)    All other components within normal limits  URINALYSIS, ROUTINE W REFLEX MICROSCOPIC - Abnormal; Notable for the following:    Glucose, UA >1000 (*)    All other components within normal limits  CBC  DIFFERENTIAL  LIPASE, BLOOD  URINE MICROSCOPIC-ADD ON   Ct Abdomen Pelvis W Contrast  08/14/2011  *RADIOLOGY REPORT*  Clinical Data: Right lower quadrant abdominal pain.  CT ABDOMEN AND PELVIS WITH CONTRAST  Technique:  Multidetector CT imaging of the abdomen and pelvis was performed following the standard protocol during bolus administration of intravenous contrast.  Contrast: OMNIPAQUE IOHEXOL 300 MG/ML  SOLN  Comparison: CT of the abdomen and pelvis 08/10/2009.  Findings:  Lung Bases: Unremarkable.  Abdomen/Pelvis:  The enhanced appearance of the liver, gallbladder, pancreas, spleen, bilateral adrenal glands and bilateral kidneys is unremarkable.  There is no ascites or pneumoperitoneum and no pathologic distension of bowel.  No definite pathologic lymphadenopathy identified within the abdomen or pelvis.  The appendix is well visualized and is normal in appearance  (retrocecal in position).  There are a few colonic diverticula, without surrounding inflammatory changes to suggest acute diverticulitis at this time.  Postoperative changes of mesh repair for left-sided anterior abdominal wall hernia are noted.  There is a small ventral hernia containing only omental fat best demonstrated on images 54 - 55 of series 2.  The prostate and urinary bladder are unremarkable in appearance.  Musculoskeletal: There are no aggressive appearing lytic or blastic lesions noted in the visualized portions of the skeleton.  IMPRESSION: 1.  Small ventral hernia containing  only omental fat.  Whether not this is related to the patient's acute abdominal pain is uncertain. There also postoperative changes of mesh repair for prior left- sided anterior abdominal wall hernia. 2.  Normal appendix (retrocecal). 3.  No other potential acute findings identified within the abdomen or pelvis. 4.  Mild colonic diverticulosis without evidence to suggest acute diverticulitis.  Original Report Authenticated By: Florencia Reasons, M.D.     1. Abdominal pain   2. Ventral hernia       MDM  Right-sided abdominal pain with nausea, vomiting, diarrhea for the past day. History of ventral hernia repair. Tender to palpation the right lower quadrant with guarding. No testicular pain.  Hyperglycemia without DKA. CT remarkable for normal appendix. Ventral hernia with omental fat is nontender on exam. No other explanation for patient's pain. No hematuria or infection.  Results discussed with patient. Return precautions discussed.  I personally performed the services described in this documentation, which was scribed in my presence.  The recorded information has been reviewed and considered.       Glynn Octave, MD 08/14/11 [phone]

## 2011-11-14 ENCOUNTER — Emergency Department: Payer: Self-pay | Admitting: Emergency Medicine

## 2011-11-14 LAB — COMPREHENSIVE METABOLIC PANEL
Alkaline Phosphatase: 87 U/L (ref 50–136)
Calcium, Total: 9.7 mg/dL (ref 8.5–10.1)
Chloride: 100 mmol/L (ref 98–107)
Co2: 30 mmol/L (ref 21–32)
EGFR (African American): >60
EGFR (Non-African Amer.): >60
Potassium: 4.4 mmol/L (ref 3.5–5.1)
SGOT(AST): 34 U/L (ref 15–37)
SGPT (ALT): 67 U/L (ref 12–78)

## 2011-11-14 LAB — URINALYSIS, COMPLETE
Glucose,UR: NEGATIVE mg/dL (ref 0–75)
Ketone: NEGATIVE
Leukocyte Esterase: NEGATIVE
Nitrite: NEGATIVE
RBC,UR: NONE SEEN /HPF (ref 0–5)
Squamous Epithelial: NONE SEEN
WBC UR: <1 /HPF (ref 0–5)

## 2011-11-14 LAB — CBC
HGB: 14.3 g/dL (ref 13.0–18.0)
MCH: 27.6 pg (ref 26.0–34.0)
RBC: 5.17 10*6/uL (ref 4.40–5.90)
WBC: 8.6 10*3/uL (ref 3.8–10.6)

## 2012-09-14 ENCOUNTER — Emergency Department: Payer: Self-pay | Admitting: Emergency Medicine

## 2013-01-17 ENCOUNTER — Emergency Department: Payer: Self-pay | Admitting: Emergency Medicine

## 2013-01-17 LAB — COMPREHENSIVE METABOLIC PANEL
Alkaline Phosphatase: 82 U/L (ref 50–136)
Anion Gap: 7 (ref 7–16)
BUN: 8 mg/dL (ref 7–18)
Bilirubin,Total: 0.2 mg/dL (ref 0.2–1.0)
Calcium, Total: 8.1 mg/dL — ABNORMAL LOW (ref 8.5–10.1)
EGFR (African American): >60
EGFR (Non-African Amer.): >60
Glucose: 296 mg/dL — ABNORMAL HIGH (ref 65–99)
Osmolality: 281 (ref 275–301)
Potassium: 3.6 mmol/L (ref 3.5–5.1)
SGOT(AST): 29 U/L (ref 15–37)
SGPT (ALT): 60 U/L (ref 12–78)

## 2013-01-17 LAB — URINALYSIS, COMPLETE
Bacteria: NONE SEEN
Glucose,UR: >=500 mg/dL (ref 0–75)
Ketone: NEGATIVE
Leukocyte Esterase: NEGATIVE
Nitrite: NEGATIVE
Ph: 5 (ref 4.5–8.0)
Protein: NEGATIVE
RBC,UR: NONE SEEN /HPF (ref 0–5)
Squamous Epithelial: NONE SEEN
WBC UR: <1 /HPF (ref 0–5)

## 2013-01-17 LAB — CBC
HCT: 40.3 % (ref 40.0–52.0)
HGB: 13.9 g/dL (ref 13.0–18.0)
MCV: 82 fL (ref 80–100)
Platelet: 323 10*3/uL (ref 150–440)

## 2013-04-08 ENCOUNTER — Encounter (HOSPITAL_COMMUNITY): Payer: Self-pay | Admitting: Emergency Medicine

## 2013-04-08 ENCOUNTER — Emergency Department (HOSPITAL_COMMUNITY): Payer: BC Managed Care – PPO

## 2013-04-08 ENCOUNTER — Emergency Department (HOSPITAL_COMMUNITY)
Admission: EM | Admit: 2013-04-08 | Discharge: 2013-04-08 | Disposition: A | Discharge status: Home / Self Care | Payer: BC Managed Care – PPO | Attending: Emergency Medicine | Admitting: Emergency Medicine

## 2013-04-08 DIAGNOSIS — Z7982 Long term (current) use of aspirin: Secondary | ICD-10-CM | POA: Insufficient documentation

## 2013-04-08 DIAGNOSIS — Z79899 Other long term (current) drug therapy: Secondary | ICD-10-CM | POA: Insufficient documentation

## 2013-04-08 DIAGNOSIS — Z87891 Personal history of nicotine dependence: Secondary | ICD-10-CM | POA: Insufficient documentation

## 2013-04-08 DIAGNOSIS — S99929A Unspecified injury of unspecified foot, initial encounter: Principal | ICD-10-CM

## 2013-04-08 DIAGNOSIS — Y929 Unspecified place or not applicable: Secondary | ICD-10-CM | POA: Insufficient documentation

## 2013-04-08 DIAGNOSIS — Y9339 Activity, other involving climbing, rappelling and jumping off: Secondary | ICD-10-CM | POA: Insufficient documentation

## 2013-04-08 DIAGNOSIS — S8990XA Unspecified injury of unspecified lower leg, initial encounter: Secondary | ICD-10-CM | POA: Insufficient documentation

## 2013-04-08 DIAGNOSIS — Z880 Allergy status to penicillin: Secondary | ICD-10-CM | POA: Insufficient documentation

## 2013-04-08 DIAGNOSIS — S99919A Unspecified injury of unspecified ankle, initial encounter: Principal | ICD-10-CM

## 2013-04-08 DIAGNOSIS — E119 Type 2 diabetes mellitus without complications: Secondary | ICD-10-CM | POA: Insufficient documentation

## 2013-04-08 DIAGNOSIS — M25462 Effusion, left knee: Secondary | ICD-10-CM

## 2013-04-08 DIAGNOSIS — W11XXXA Fall on and from ladder, initial encounter: Secondary | ICD-10-CM | POA: Insufficient documentation

## 2013-04-08 DIAGNOSIS — I10 Essential (primary) hypertension: Secondary | ICD-10-CM | POA: Insufficient documentation

## 2013-04-08 MED ORDER — OXYCODONE-ACETAMINOPHEN 5-325 MG PO TABS
2.0000 | ORAL_TABLET | Freq: Once | ORAL | Status: AC
  Administered 2013-04-08: 2 via ORAL
  Filled 2013-04-08: qty 2

## 2013-04-08 MED ORDER — IBUPROFEN 600 MG PO TABS: 600.0000 mg | ORAL_TABLET | Freq: Four times a day (QID) | ORAL | Status: DC | PRN

## 2013-04-08 MED ORDER — OXYCODONE-ACETAMINOPHEN 5-325 MG PO TABS: 1.0000 | ORAL_TABLET | ORAL | Status: DC | PRN

## 2013-04-08 NOTE — Discharge Instructions (Signed)
Knee Effusion  Knee effusion means you have fluid in your knee. The knee may be more difficult to bend and move. HOME CARE  Use crutches or a brace as told by your doctor.  Put ice on the injured area.  Put ice in a plastic bag.  Place a towel between your skin and the bag.  Leave the ice on for 15-20 minutes, 03-04 times a day.  Raise (elevate) your knee as much as possible.  Only take medicine as told by your doctor.  You may need to do strengthening exercises. Ask your doctor.  Continue with your normal diet and activities as told by your doctor. GET HELP RIGHT AWAY IF:  You have more puffiness (swelling) in your knee.  You see redness, puffiness, or have more pain in your knee.  You have a temperature by mouth above 102 F (38.9 C).  You get a rash.  You have trouble breathing.  You have a reaction to any medicine you are taking.  You have a lot of pain when you move your knee. MAKE SURE YOU:  Understand these instructions.  Will watch your condition.  Will get help right away if you are not doing well or get worse. Document Released: 03/31/2010 Document Revised: 05/21/2011 Document Reviewed: 03/31/2010 [employer] Patient Information 2014 [employer].  

## 2013-04-08 NOTE — ED Notes (Signed)
PT STATES HE HYPEREXTENDED HIS LEFT KNEE LAST MONDAY. PT STATES IBUPROFEN ISN'T WORKING FOR THE PAIN.

## 2013-04-10 NOTE — ED Provider Notes (Signed)
CSN: [id number]     Arrival date & time 04/08/13  1814 History   First MD Initiated Contact with Patient 04/08/13 2122     Chief Complaint  Patient presents with  . Knee Pain   (Consider location/radiation/quality/duration/timing/severity/associated sxs/prior Treatment) HPI Comments: Jose Short is a 43 y.o. Male presenting with left knee pain and swelling since hyperflexing the knee when his right foot missed a step as he was climbing down a ladder 10 days ago.  He has persistent increased pain and swelling in the knee which is worsened with attempts at increased flexion and with weight bearing.  He has known degenerative joint disease in the knee and has receiving steroid injections in the past, but has been years since he has followed with an orthopedist.  He has taken tylenol without relief of current symptoms.      The history is provided by the patient.    Past Medical History  Diagnosis Date  . Hypertension   . Diabetes mellitus   . Hyperlipidemia    Past Surgical History  Procedure Laterality Date  . Hernia repair     Family History  Problem Relation Age of Onset  . Diabetes Mother   . Hypertension Mother   . Diabetes Father   . Hypertension Father   . Stroke Other    History  Substance Use Topics  . Smoking status: Former Smoker -- 3 years    Types: Cigarettes    Quit date: 12/10/1988  . Smokeless tobacco: Current User    Types: Snuff  . Alcohol Use: No    Review of Systems  Constitutional: Negative for fever.  Musculoskeletal: Positive for arthralgias, gait problem and joint swelling. Negative for myalgias.  Neurological: Negative for weakness and numbness.    Allergies  Penicillins  Home Medications   Current Outpatient Rx  Name  Route  Sig  Dispense  Refill  . acetaminophen (TYLENOL) 500 MG tablet   Oral   Take 1,000 mg by mouth as needed. For pain         . aspirin EC 81 MG tablet   Oral   Take 324 mg by mouth daily.           .  fish oil-omega-3 fatty acids 1000 MG capsule   Oral   Take 1 g by mouth daily.           Marland Kitchen ibuprofen (ADVIL,MOTRIN) 600 MG tablet   Oral   Take 1 tablet (600 mg total) by mouth every 6 (six) hours as needed.   30 tablet   0   . oxyCODONE-acetaminophen (PERCOCET/ROXICET) 5-325 MG per tablet   Oral   Take 1 tablet by mouth every 4 (four) hours as needed for severe pain.   20 tablet   0    BP 133/93  Pulse 74  Temp(Src) 97.7 F (36.5 C) (Oral)  Resp 20  Ht 6\' 2"  (1.88 m)  Wt 290 lb (131.543 kg)  BMI 37.22 kg/m2  SpO2 96% Physical Exam  Constitutional: He appears well-developed and well-nourished.  HENT:  Head: Atraumatic.  Neck: Normal range of motion.  Cardiovascular:  Pulses equal bilaterally  Musculoskeletal: He exhibits tenderness.       Left knee: He exhibits decreased range of motion, swelling and effusion. He exhibits no deformity, no erythema, no LCL laxity and no MCL laxity. Tenderness found. Medial joint line tenderness noted.  Small effusion appreciated on exam.  Neurological: He is alert. He has normal strength. He  displays normal reflexes. No sensory deficit.  Equal strength  Skin: Skin is warm and dry.  Psychiatric: He has a normal mood and affect.    ED Course  Procedures (including critical care time) Labs Review Labs Reviewed - No data to display Imaging Review Dg Knee Complete 4 Views Left  04/08/2013   CLINICAL DATA:  Fall, knee pain  EXAM: LEFT KNEE - COMPLETE 4+ VIEW  COMPARISON:  04/01/2008  FINDINGS: Minor arthritic change predominantly in the medial compartment with joint space loss and bony spurring. No fracture, malalignment or large effusion.  IMPRESSION: Left knee osteoarthritis.  No acute osseous finding   Electronically Signed   By: Ruel Favorsrevor  Shick M.D.   On: 04/08/2013 21:38    EKG Interpretation   None       MDM   1. Knee effusion, left    Patients labs and/or radiological studies were viewed and considered during the medical  decision making and disposition process. Pt has knee immobilizer at home, encouraged to start using, crutches given today.  Encouraged RICE,  Referral to ortho for further eval.  Prescribed oxycodone, ibuprofen.    Burgess AmorJulie Ashonte Angelucci, PA-C 04/10/13 [phone]

## 2013-04-11 NOTE — ED Provider Notes (Signed)
Medical screening examination/treatment/procedure(s) were performed by non-physician practitioner and as supervising physician I was immediately available for consultation/collaboration.  Gaylynn Seiple, MD 04/11/13 1529 

## 2013-08-02 ENCOUNTER — Encounter (HOSPITAL_COMMUNITY): Payer: Self-pay | Admitting: Emergency Medicine

## 2013-08-02 ENCOUNTER — Inpatient Hospital Stay (HOSPITAL_COMMUNITY)
Admission: EM | Admit: 2013-08-02 | Discharge: 2013-08-04 | DRG: 392 | Disposition: A | Discharge status: Home / Self Care | Payer: BC Managed Care – PPO | Attending: Internal Medicine | Admitting: Internal Medicine

## 2013-08-02 ENCOUNTER — Emergency Department (HOSPITAL_COMMUNITY): Payer: BC Managed Care – PPO

## 2013-08-02 DIAGNOSIS — Z823 Family history of stroke: Secondary | ICD-10-CM

## 2013-08-02 DIAGNOSIS — A088 Other specified intestinal infections: Principal | ICD-10-CM | POA: Diagnosis present

## 2013-08-02 DIAGNOSIS — Z8249 Family history of ischemic heart disease and other diseases of the circulatory system: Secondary | ICD-10-CM

## 2013-08-02 DIAGNOSIS — R935 Abnormal findings on diagnostic imaging of other abdominal regions, including retroperitoneum: Secondary | ICD-10-CM | POA: Diagnosis present

## 2013-08-02 DIAGNOSIS — Z833 Family history of diabetes mellitus: Secondary | ICD-10-CM

## 2013-08-02 DIAGNOSIS — E860 Dehydration: Secondary | ICD-10-CM | POA: Diagnosis present

## 2013-08-02 DIAGNOSIS — I10 Essential (primary) hypertension: Secondary | ICD-10-CM | POA: Diagnosis present

## 2013-08-02 DIAGNOSIS — D72829 Elevated white blood cell count, unspecified: Secondary | ICD-10-CM | POA: Diagnosis present

## 2013-08-02 DIAGNOSIS — R197 Diarrhea, unspecified: Secondary | ICD-10-CM

## 2013-08-02 DIAGNOSIS — E785 Hyperlipidemia, unspecified: Secondary | ICD-10-CM | POA: Diagnosis present

## 2013-08-02 DIAGNOSIS — Z87891 Personal history of nicotine dependence: Secondary | ICD-10-CM

## 2013-08-02 DIAGNOSIS — R112 Nausea with vomiting, unspecified: Secondary | ICD-10-CM | POA: Diagnosis present

## 2013-08-02 DIAGNOSIS — E119 Type 2 diabetes mellitus without complications: Secondary | ICD-10-CM | POA: Diagnosis present

## 2013-08-02 DIAGNOSIS — K529 Noninfective gastroenteritis and colitis, unspecified: Secondary | ICD-10-CM

## 2013-08-02 LAB — CBC WITH DIFFERENTIAL/PLATELET
BASOS ABS: 0 10*3/uL (ref 0.0–0.1)
Basophils Relative: 0 % (ref 0–1)
EOS ABS: 0.2 10*3/uL (ref 0.0–0.7)
Eosinophils Relative: 2 % (ref 0–5)
HCT: 46.7 % (ref 39.0–52.0)
HEMOGLOBIN: 15.9 g/dL (ref 13.0–17.0)
LYMPHS PCT: 20 % (ref 12–46)
Lymphs Abs: 2.2 10*3/uL (ref 0.7–4.0)
MCH: 27.7 pg (ref 26.0–34.0)
MCHC: 34 g/dL (ref 30.0–36.0)
MCV: 81.5 fL (ref 78.0–100.0)
Monocytes Absolute: 0.9 10*3/uL (ref 0.1–1.0)
Monocytes Relative: 8 % (ref 3–12)
NEUTROS PCT: 70 % (ref 43–77)
Neutro Abs: 7.9 10*3/uL — ABNORMAL HIGH (ref 1.7–7.7)
Platelets: 326 10*3/uL (ref 150–400)
RBC: 5.73 MIL/uL (ref 4.22–5.81)
RDW: 13.2 % (ref 11.5–15.5)
WBC: 11.2 10*3/uL — ABNORMAL HIGH (ref 4.0–10.5)

## 2013-08-02 LAB — COMPREHENSIVE METABOLIC PANEL
ALBUMIN: 3.9 g/dL (ref 3.5–5.2)
ALK PHOS: 84 U/L (ref 39–117)
ALT: 43 U/L (ref 0–53)
AST: 20 U/L (ref 0–37)
BUN: 12 mg/dL (ref 6–23)
CO2: 23 mEq/L (ref 19–32)
Calcium: 9.3 mg/dL (ref 8.4–10.5)
Chloride: 96 mEq/L (ref 96–112)
Creatinine, Ser: 0.68 mg/dL (ref 0.50–1.35)
GFR calc Af Amer: >90 mL/min (ref >90)
GFR calc non Af Amer: >90 mL/min (ref >90)
Glucose, Bld: 161 mg/dL — ABNORMAL HIGH (ref 70–99)
POTASSIUM: 3.9 meq/L (ref 3.7–5.3)
SODIUM: 134 meq/L — AB (ref 137–147)
TOTAL PROTEIN: 7.8 g/dL (ref 6.0–8.3)
Total Bilirubin: 0.3 mg/dL (ref 0.3–1.2)

## 2013-08-02 LAB — CBG MONITORING, ED: GLUCOSE-CAPILLARY: 168 mg/dL — AB (ref 70–99)

## 2013-08-02 MED ORDER — MORPHINE SULFATE 4 MG/ML IJ SOLN
4.0000 mg | Freq: Once | INTRAMUSCULAR | Status: AC
  Administered 2013-08-02: 4 mg via INTRAVENOUS
  Filled 2013-08-02: qty 1

## 2013-08-02 MED ORDER — ONDANSETRON HCL 4 MG/2ML IJ SOLN
4.0000 mg | Freq: Once | INTRAMUSCULAR | Status: AC
  Administered 2013-08-02: 4 mg via INTRAVENOUS
  Filled 2013-08-02: qty 2

## 2013-08-02 MED ORDER — SODIUM CHLORIDE 0.9 % IV BOLUS (SEPSIS)
1000.0000 mL | Freq: Once | INTRAVENOUS | Status: AC
Start: 2013-08-02 — End: 2013-08-02
  Administered 2013-08-02: 1000 mL via INTRAVENOUS

## 2013-08-02 MED ORDER — IOHEXOL 300 MG/ML  SOLN
50.0000 mL | Freq: Once | INTRAMUSCULAR | Status: AC | PRN
  Administered 2013-08-02: 50 mL via ORAL

## 2013-08-02 MED ORDER — SODIUM CHLORIDE 0.9 % IV SOLN
INTRAVENOUS | Status: DC
  Administered 2013-08-02: 23:00:00 via INTRAVENOUS

## 2013-08-02 MED ORDER — LOPERAMIDE HCL 2 MG PO CAPS
2.0000 mg | ORAL_CAPSULE | ORAL | Status: DC | PRN
  Administered 2013-08-02 (×2): 2 mg via ORAL
  Filled 2013-08-02 (×2): qty 1

## 2013-08-02 NOTE — ED Notes (Signed)
Patient states that he has been vomiting. Nurse aware. Patient given Coke at [facility] time.

## 2013-08-02 NOTE — ED Notes (Signed)
Drinking contrast, c/o nausea

## 2013-08-02 NOTE — ED Notes (Signed)
Onset diarrhea and nausea 5:30am. No relief with immodium

## 2013-08-02 NOTE — ED Provider Notes (Signed)
CSN: [id number]     Arrival date & time 08/02/13  2057 History  This chart was scribed for Toy Baker, MD by Quintella Reichert, ED scribe.  This patient was seen in room APA10/[facility]10 and the patient's care was started at 9:35 PM.   Chief Complaint  Patient presents with  . Diarrhea    The history is provided by the patient. No language interpreter was used.    HPI Comments: Jose Short is a 43 y.o. male who presents to the Emergency Department complaining of persistent diarrhea that began this morning at 5:30 AM.  Pt states he has had at least 5 episodes of watery, non-bloody stool in the past 2 hours.  He also reports some mild abdominal cramping associated with these episodes but denies any other abdominal pain.  He notes some nausea but denies vomiting.  He denies fever.  Pt denies prior h/o similar symptoms.  He denies recent antibiotic usage.  He notes that he was placed on a new DM medication 2 weeks ago but he has had no other unusual symptoms since then before today.  He did have some new food at his son's wedding yesterday.    Past Medical History  Diagnosis Date  . Hypertension   . Diabetes mellitus   . Hyperlipidemia     Past Surgical History  Procedure Laterality Date  . Hernia repair      Family History  Problem Relation Age of Onset  . Diabetes Mother   . Hypertension Mother   . Diabetes Father   . Hypertension Father   . Stroke Other     History  Substance Use Topics  . Smoking status: Former Smoker -- 3 years    Types: Cigarettes    Quit date: 12/10/1988  . Smokeless tobacco: Current User    Types: Snuff  . Alcohol Use: No     Review of Systems  Constitutional: Negative for fever.  Gastrointestinal: Positive for nausea, abdominal pain (cramping, associated with diarrhea) and diarrhea. Negative for vomiting and blood in stool.  All other systems reviewed and are negative.     Allergies  Penicillins  Home Medications   Prior to  Admission medications   Medication Sig Start Date End Date Taking? Authorizing Provider  lisinopril (PRINIVIL,ZESTRIL) 10 MG tablet Take 10 mg by mouth daily.   Yes Historical Provider, MD  pravastatin (PRAVACHOL) 20 MG tablet Take 20 mg by mouth daily.   Yes Historical Provider, MD  acetaminophen (TYLENOL) 500 MG tablet Take 1,000 mg by mouth as needed. For pain    Historical Provider, MD  aspirin EC 81 MG tablet Take 324 mg by mouth daily.      Historical Provider, MD  fish oil-omega-3 fatty acids 1000 MG capsule Take 1 g by mouth daily.      Historical Provider, MD  ibuprofen (ADVIL,MOTRIN) 600 MG tablet Take 1 tablet (600 mg total) by mouth every 6 (six) hours as needed. 04/08/13   Burgess Amor, PA-C  oxyCODONE-acetaminophen (PERCOCET/ROXICET) 5-325 MG per tablet Take 1 tablet by mouth every 4 (four) hours as needed for severe pain. 04/08/13   Burgess Amor, PA-C   BP 118/88  Pulse 81  Temp(Src) 98.1 F (36.7 C) (Oral)  Resp 18  Ht 6\' 2"  (1.88 m)  Wt 275 lb (124.739 kg)  BMI 35.29 kg/m2  SpO2 99%  Physical Exam  Nursing note and vitals reviewed. Constitutional: He is oriented to person, place, and time. He appears well-developed and well-nourished.  Non-toxic appearance. No distress.  HENT:  Head: Normocephalic and atraumatic.  Eyes: Conjunctivae, EOM and lids are normal. Pupils are equal, round, and reactive to light.  Neck: Normal range of motion. Neck supple. No tracheal deviation present. No mass present.  Cardiovascular: Normal rate, regular rhythm and normal heart sounds.  Exam reveals no gallop.   No murmur heard. Pulmonary/Chest: Effort normal and breath sounds normal. No stridor. No respiratory distress. He has no decreased breath sounds. He has no wheezes. He has no rhonchi. He has no rales.  Abdominal: Soft. Normal appearance and bowel sounds are normal. He exhibits no distension. There is no tenderness. There is no rebound and no CVA tenderness.  Musculoskeletal: Normal range  of motion. He exhibits no edema and no tenderness.  Neurological: He is alert and oriented to person, place, and time. He has normal strength. No cranial nerve deficit or sensory deficit. GCS eye subscore is 4. GCS verbal subscore is 5. GCS motor subscore is 6.  Skin: Skin is warm and dry. No abrasion and no rash noted.  Psychiatric: He has a normal mood and affect. His speech is normal and behavior is normal.    ED Course  Procedures (including critical care time)  DIAGNOSTIC STUDIES: Oxygen Saturation is 99% on room air, normal by my interpretation.    COORDINATION OF CARE: 9:40 PM-Discussed treatment plan which includes IV fluids and labs with pt at bedside and pt agreed to plan.    Labs Review Labs Reviewed  CBG MONITORING, ED - Abnormal; Notable for the following:    Glucose-Capillary 168 (*)    All other components within normal limits  STOOL CULTURE  CBC WITH DIFFERENTIAL  COMPREHENSIVE METABOLIC PANEL    Imaging Review No results found.   EKG Interpretation None      MDM   Final diagnoses:  None   I personally performed the services described in this documentation, which was scribed in my presence. The recorded information has been reviewed and is accurate.   Patient given IV fluids and anti-motility drugs. He continues to have profound diarrhea. He also notes cramping. Slight elevation in his white count noted. Patient had emesis here. Due to the patient's increasing symptoms of abdominal cramping and pain, will order abdominal CT. Potassium is still pending. We'll sign patient out to Dr. Everlean CherryMiller  Jose Short T Xaria Judon, MD 08/02/13 2322

## 2013-08-03 ENCOUNTER — Encounter (HOSPITAL_COMMUNITY): Payer: Self-pay | Admitting: Internal Medicine

## 2013-08-03 DIAGNOSIS — R197 Diarrhea, unspecified: Secondary | ICD-10-CM

## 2013-08-03 DIAGNOSIS — E785 Hyperlipidemia, unspecified: Secondary | ICD-10-CM | POA: Diagnosis present

## 2013-08-03 DIAGNOSIS — E119 Type 2 diabetes mellitus without complications: Secondary | ICD-10-CM | POA: Diagnosis present

## 2013-08-03 DIAGNOSIS — R112 Nausea with vomiting, unspecified: Secondary | ICD-10-CM | POA: Diagnosis present

## 2013-08-03 DIAGNOSIS — R935 Abnormal findings on diagnostic imaging of other abdominal regions, including retroperitoneum: Secondary | ICD-10-CM | POA: Diagnosis present

## 2013-08-03 DIAGNOSIS — K5289 Other specified noninfective gastroenteritis and colitis: Secondary | ICD-10-CM

## 2013-08-03 DIAGNOSIS — I10 Essential (primary) hypertension: Secondary | ICD-10-CM | POA: Diagnosis present

## 2013-08-03 LAB — URINALYSIS, ROUTINE W REFLEX MICROSCOPIC
BILIRUBIN URINE: NEGATIVE
Glucose, UA: >1000 mg/dL — AB
HGB URINE DIPSTICK: NEGATIVE
Ketones, ur: NEGATIVE mg/dL
Leukocytes, UA: NEGATIVE
Nitrite: NEGATIVE
PH: 5.5 (ref 5.0–8.0)
Protein, ur: NEGATIVE mg/dL
Specific Gravity, Urine: 1.01 (ref 1.005–1.030)
Urobilinogen, UA: 0.2 mg/dL (ref 0.0–1.0)

## 2013-08-03 LAB — GLUCOSE, CAPILLARY
GLUCOSE-CAPILLARY: 152 mg/dL — AB (ref 70–99)
GLUCOSE-CAPILLARY: 127 mg/dL — AB (ref 70–99)
Glucose-Capillary: 230 mg/dL — ABNORMAL HIGH (ref 70–99)
Glucose-Capillary: 117 mg/dL — ABNORMAL HIGH (ref 70–99)
Glucose-Capillary: 250 mg/dL — ABNORMAL HIGH (ref 70–99)

## 2013-08-03 LAB — LIPASE, BLOOD: LIPASE: 25 U/L (ref 11–59)

## 2013-08-03 LAB — URINE MICROSCOPIC-ADD ON

## 2013-08-03 MED ORDER — ONDANSETRON HCL 4 MG/2ML IJ SOLN
4.0000 mg | Freq: Four times a day (QID) | INTRAMUSCULAR | Status: DC | PRN
  Administered 2013-08-03: 4 mg via INTRAVENOUS
  Filled 2013-08-03: qty 2

## 2013-08-03 MED ORDER — IOHEXOL 300 MG/ML  SOLN
100.0000 mL | Freq: Once | INTRAMUSCULAR | Status: AC | PRN
  Administered 2013-08-03: 100 mL via INTRAVENOUS

## 2013-08-03 MED ORDER — HYDROMORPHONE HCL PF 1 MG/ML IJ SOLN
0.5000 mg | INTRAMUSCULAR | Status: DC | PRN
  Administered 2013-08-03 (×2): 0.5 mg via INTRAVENOUS
  Filled 2013-08-03 (×2): qty 1

## 2013-08-03 MED ORDER — INSULIN ASPART 100 UNIT/ML ~~LOC~~ SOLN
0.0000 [IU] | SUBCUTANEOUS | Status: DC
Start: 2013-08-03 — End: 2013-08-04
  Administered 2013-08-03: 5 [IU] via SUBCUTANEOUS
  Administered 2013-08-03: 2 [IU] via SUBCUTANEOUS
  Administered 2013-08-03: 3 [IU] via SUBCUTANEOUS
  Administered 2013-08-03: 5 [IU] via SUBCUTANEOUS
  Administered 2013-08-04 (×2): 3 [IU] via SUBCUTANEOUS
  Administered 2013-08-04: 2 [IU] via SUBCUTANEOUS
  Administered 2013-08-04: 3 [IU] via SUBCUTANEOUS

## 2013-08-03 MED ORDER — OXYCODONE HCL 5 MG PO TABS
5.0000 mg | ORAL_TABLET | ORAL | Status: DC | PRN
  Administered 2013-08-04 (×2): 5 mg via ORAL
  Filled 2013-08-03 (×2): qty 1

## 2013-08-03 MED ORDER — ACETAMINOPHEN 325 MG PO TABS: 650.0000 mg | ORAL_TABLET | Freq: Four times a day (QID) | ORAL | Status: DC | PRN

## 2013-08-03 MED ORDER — METRONIDAZOLE IN NACL 5-0.79 MG/ML-% IV SOLN
500.0000 mg | Freq: Three times a day (TID) | INTRAVENOUS | Status: DC
  Administered 2013-08-03 – 2013-08-04 (×5): 500 mg via INTRAVENOUS
  Filled 2013-08-03 (×5): qty 100

## 2013-08-03 MED ORDER — ONDANSETRON HCL 4 MG PO TABS: 4.0000 mg | ORAL_TABLET | Freq: Four times a day (QID) | ORAL | Status: DC | PRN

## 2013-08-03 MED ORDER — CIPROFLOXACIN IN D5W 400 MG/200ML IV SOLN
400.0000 mg | Freq: Once | INTRAVENOUS | Status: AC
  Administered 2013-08-03: 400 mg via INTRAVENOUS
  Filled 2013-08-03: qty 200

## 2013-08-03 MED ORDER — PANTOPRAZOLE SODIUM 40 MG IV SOLR
40.0000 mg | Freq: Two times a day (BID) | INTRAVENOUS | Status: DC
  Administered 2013-08-03 – 2013-08-04 (×4): 40 mg via INTRAVENOUS
  Filled 2013-08-03 (×4): qty 40

## 2013-08-03 MED ORDER — SODIUM CHLORIDE 0.9 % IV SOLN
INTRAVENOUS | Status: DC
  Administered 2013-08-03: 21:00:00 via INTRAVENOUS

## 2013-08-03 MED ORDER — PROMETHAZINE HCL 25 MG/ML IJ SOLN
12.5000 mg | Freq: Four times a day (QID) | INTRAMUSCULAR | Status: DC | PRN
  Administered 2013-08-03 (×2): 12.5 mg via INTRAVENOUS
  Filled 2013-08-03 (×2): qty 1

## 2013-08-03 MED ORDER — METRONIDAZOLE IN NACL 5-0.79 MG/ML-% IV SOLN
500.0000 mg | Freq: Once | INTRAVENOUS | Status: AC
  Administered 2013-08-03: 500 mg via INTRAVENOUS
  Filled 2013-08-03: qty 100

## 2013-08-03 MED ORDER — ACETAMINOPHEN 650 MG RE SUPP: 650.0000 mg | Freq: Four times a day (QID) | RECTAL | Status: DC | PRN

## 2013-08-03 MED ORDER — CIPROFLOXACIN IN D5W 400 MG/200ML IV SOLN
400.0000 mg | Freq: Two times a day (BID) | INTRAVENOUS | Status: DC
  Administered 2013-08-03 – 2013-08-04 (×3): 400 mg via INTRAVENOUS
  Filled 2013-08-03 (×2): qty 200

## 2013-08-03 MED ORDER — ENOXAPARIN SODIUM 40 MG/0.4ML ~~LOC~~ SOLN: 40.0000 mg | SUBCUTANEOUS | Status: DC

## 2013-08-03 NOTE — ED Notes (Signed)
Ct scan done, no further vomiting

## 2013-08-03 NOTE — H&P (Signed)
[facility] History and Physical  FLORENCE ANTONELLI MWN:[id number] DOB: 1971/02/21 DOA: 08/02/2013   PCP: Purvis Kilts, MD  Specialists: He is supposed to see Dr. Luna Glasgow orthopedics in the near future for knee pain  Chief Complaint: Diarrhea and abdominal discomfort since this morning  HPI: Jose Short is a 43 y.o. male with a past medical history of hypertension, diabetes mellitus type II, hyperlipidemia, who was in his usual state of health till about 5:30 this morning when he started having diarrhea. He describes is as watery stool without blood and light brown in color. He's had abdominal cramping as a result of diarrhea. Denied any abdominal pain, per se. He took 3 Imodium as at home with no relief. He came to the emergency department and was given more Imodium without any relief. He was nauseous at home, but had not vomited. After receiving morphine in the hospital he started having vomiting as well. No blood in emesis. Denies any fever, chills recently. No urinary complaints. Denies any sick contacts or recent travel. He did have a get-together yesterday, where he made hotdogs and had potato salad. Had food that other people that brought in as well. Denies history of colonoscopy.  Home Medications: Prior to Admission medications   Medication Sig Start Date End Date Taking? Authorizing Provider  aspirin EC 81 MG tablet Take 81 mg by mouth daily.    Yes Historical Provider, MD  Canagliflozin-Metformin HCl (INVOKAMET) 50-500 MG TABS Take 1 tablet by mouth 2 (two) times daily.   Yes Historical Provider, MD  fish oil-omega-3 fatty acids 1000 MG capsule Take 1 g by mouth daily.     Yes Historical Provider, MD  lisinopril (PRINIVIL,ZESTRIL) 10 MG tablet Take 10 mg by mouth daily.   Yes Historical Provider, MD  pravastatin (PRAVACHOL) 20 MG tablet Take 20 mg by mouth daily.   Yes Historical Provider, MD  acetaminophen (TYLENOL) 500 MG tablet Take 1,000 mg by mouth as needed. For  pain    Historical Provider, MD    Allergies:  Allergies  Allergen Reactions  . Penicillins Rash    Past Medical History: Past Medical History  Diagnosis Date  . Hypertension   . Diabetes mellitus   . Hyperlipidemia     Past Surgical History  Procedure Laterality Date  . Hernia repair      Social History: Lives with his wife. He works for [employer]. No smoking, alcohol or illicit drug use. Independent with daily activities.  Family History:  Family History  Problem Relation Age of Onset  . Diabetes Mother   . Hypertension Mother   . Diabetes Father   . Hypertension Father   . Stroke Other      Review of Systems - History obtained from the patient General ROS: positive for  - fatigue Psychological ROS: negative Ophthalmic ROS: negative ENT ROS: negative Allergy and Immunology ROS: negative Hematological and Lymphatic ROS: negative Endocrine ROS: negative Respiratory ROS: no cough, shortness of breath, or wheezing Cardiovascular ROS: no chest pain or dyspnea on exertion Gastrointestinal ROS: as in hpi Genito-Urinary ROS: no dysuria, trouble voiding, or hematuria Musculoskeletal ROS: negative Neurological ROS: no TIA or stroke symptoms Dermatological ROS: negative  Physical Examination  Filed Vitals:   08/02/13 2121 08/03/13 0006  BP: 118/88 145/94  Pulse: 81 69  Temp: 98.1 F (36.7 C)   TempSrc: Oral   Resp: 18 20  Height: _0  (1.88 m)   Weight: 124.739 kg (275 lb)   SpO2:  99% 99%    BP 145/94  Pulse 69  Temp(Src) 98.1 F (36.7 C) (Oral)  Resp 20  Ht $R'6\' 2"'WI$  (1.88 m)  Wt 124.739 kg (275 lb)  BMI 35.29 kg/m2  SpO2 99%  General appearance: alert, cooperative, appears stated age, no distress and moderately obese Head: Normocephalic, without obvious abnormality, atraumatic Eyes: conjunctivae/corneas clear. PERRL, EOM's intact.  Throat: lips, mucosa, and tongue normal; teeth and gums normal Neck: no adenopathy, no carotid bruit, no JVD,  supple, symmetrical, trachea midline and thyroid not enlarged, symmetric, no tenderness/mass/nodules Resp: clear to auscultation bilaterally Cardio: regular rate and rhythm, S1, S2 normal, no murmur, click, rub or gallop GI: Abdomen is obese, soft. Mild tenderness in the upper abdomen without any rebound, rigidity, or guarding. No masses, organomegaly. Bowel sounds are sluggish. Extremities: extremities normal, atraumatic, no cyanosis or edema Pulses: 2+ and symmetric Skin: Skin color, texture, turgor normal. No rashes or lesions Lymph nodes: Cervical, supraclavicular, and axillary nodes normal. Neurologic: No focal neurological deficits.  Laboratory Data: Results for orders placed during the hospital encounter of 08/02/13 (from the past 48 hour(s))  CBG MONITORING, ED     Status: Abnormal   Collection Time    08/02/13  9:31 PM      Result Value Ref Range   Glucose-Capillary 168 (*) 70 - 99 mg/dL  CBC WITH DIFFERENTIAL     Status: Abnormal   Collection Time    08/02/13 10:05 PM      Result Value Ref Range   WBC 11.2 (*) 4.0 - 10.5 K/uL   RBC 5.73  4.22 - 5.81 MIL/uL   Hemoglobin 15.9  13.0 - 17.0 g/dL   HCT 46.7  39.0 - 52.0 %   MCV 81.5  78.0 - 100.0 fL   MCH 27.7  26.0 - 34.0 pg   MCHC 34.0  30.0 - 36.0 g/dL   RDW 13.2  11.5 - 15.5 %   Platelets 326  150 - 400 K/uL   Neutrophils Relative % 70  43 - 77 %   Lymphocytes Relative 20  12 - 46 %   Monocytes Relative 8  3 - 12 %   Eosinophils Relative 2  0 - 5 %   Basophils Relative 0  0 - 1 %   Neutro Abs 7.9 (*) 1.7 - 7.7 K/uL   Lymphs Abs 2.2  0.7 - 4.0 K/uL   Monocytes Absolute 0.9  0.1 - 1.0 K/uL   Eosinophils Absolute 0.2  0.0 - 0.7 K/uL   Basophils Absolute 0.0  0.0 - 0.1 K/uL   WBC Morphology ATYPICAL LYMPHOCYTES    COMPREHENSIVE METABOLIC PANEL     Status: Abnormal   Collection Time    08/02/13 11:10 PM      Result Value Ref Range   Sodium 134 (*) 137 - 147 mEq/L   Potassium 3.9  3.7 - 5.3 mEq/L   Chloride 96  96 -  112 mEq/L   CO2 23  19 - 32 mEq/L   Glucose, Bld 161 (*) 70 - 99 mg/dL   BUN 12  6 - 23 mg/dL   Creatinine, Ser 0.68  0.50 - 1.35 mg/dL   Calcium 9.3  8.4 - 10.5 mg/dL   Total Protein 7.8  6.0 - 8.3 g/dL   Albumin 3.9  3.5 - 5.2 g/dL   AST 20  0 - 37 U/L   ALT 43  0 - 53 U/L   Alkaline Phosphatase 84  39 - 117 U/L  Total Bilirubin 0.3  0.3 - 1.2 mg/dL   GFR calc non Af Amer >90  >90 mL/min   GFR calc Af Amer >90  >90 mL/min   Comment: (NOTE)     The eGFR has been calculated using the CKD EPI equation.     This calculation has not been validated in all clinical situations.     eGFR's persistently <90 mL/min signify possible Chronic Kidney     Disease.    Radiology Reports: Ct Abdomen Pelvis W Contrast  08/03/2013   CLINICAL DATA:  New onset of nausea and diarrhea.  EXAM: CT ABDOMEN AND PELVIS WITH CONTRAST  TECHNIQUE: Multidetector CT imaging of the abdomen and pelvis was performed using the standard protocol following bolus administration of intravenous contrast.  CONTRAST:  4m OMNIPAQUE IOHEXOL 300 MG/ML SOLN, 1044mOMNIPAQUE IOHEXOL 300 MG/ML SOLN  COMPARISON:  CT of the abdomen and pelvis performed 08/14/2011  FINDINGS: The visualized lung bases are clear.  Prominent intrahepatic air is noted, most likely within the portal venous system, predominantly within the right hepatic lobe. Surrounding vaguely decreased attenuation is seen involving the hepatic parenchyma, raising suspicion for underlying infection of uncertain etiology. There is no evidence of abscess formation at this time.  A small amount of air is noted within the venous vasculature surrounding the stomach, and portal venous gas is thought most likely to reflect extension of benign gastric emphysema. There is no evidence of gastric wall thickening or irregularity to suggest emphysematous gastritis. The etiology of the apparent infection within the liver is unclear.  The gallbladder is within normal limits. The pancreas and  adrenal glands are unremarkable.  The kidneys are unremarkable in appearance. There is no evidence of hydronephrosis. No renal or ureteral stones are seen. Minimal nonspecific perinephric stranding is noted bilaterally.  The anterior abdominal wall mesh is noted on the left side, with a small periumbilical hernia again noted just to the right of midline, adjacent to the edge of the mesh.  No free fluid is identified. The small bowel is unremarkable in appearance. The stomach is within normal limits. No acute vascular abnormalities are seen.  The appendix is normal in caliber, without evidence for appendicitis. The colon is unremarkable in appearance.  The bladder is mildly distended and grossly unremarkable. The prostate remains normal in size. No inguinal lymphadenopathy is seen.  No acute osseous abnormalities are identified.  IMPRESSION: 1. Prominent intrahepatic air, most likely in the portal venous system, predominantly in the right hepatic lobe. Surrounding vaguely decreased periportal parenchymal attenuation raises suspicion for underlying hepatic infection of uncertain etiology. 2. Small amount of air within the venous vasculature surrounding the stomach; the portal venous gas is thought most likely to reflect extension of benign gastric emphysema. No evidence to suggest emphysematous gastritis. This does not explain the apparent signs of infection within the liver. 3. Stable small periumbilical hernia noted just to the right of midline, adjacent to the edge of the patient's anterior abdominal wall mesh.   Electronically Signed   By: JeGarald Balding.D.   On: 08/03/2013 01:18    Problem List  Principal Problem:   Nausea vomiting and diarrhea Active Problems:   Abnormal CT of the abdomen   DM type 2 (diabetes mellitus, type 2)   HTN (hypertension), benign   Hyperlipidemia   Assessment: This is a [age]ear old, Caucasian male, who presents with the diarrhea and nausea and had a CT scan, which shows  nonspecific findings. Etiology for his symptoms is not  clear. He, however, appears to be nontoxic and hemodynamically stable.  Plan: #1 diarrhea with nausea and vomiting: Could be food born illness. However, CT scan shows findings which do not reconcile with his symptomatology. LFT's are normal. We will check a GI pathogen panel. Continue with Cipro and Flagyl for now. We will get GI input to help [place] with this matter. Hepatitis panel will be checked in the morning. He'll be kept just on ice chips for now. PPI.   #2 abnormal CT abdomen: See above. We'll get GI consultation. His abdomen is benign on examination. Check UA.  #3 history of diabetes mellitus, type II: Place him on sliding scale coverage. Check HbA1c.   #3 history of hypertension: Monitor blood pressures on the floor. Hold his oral agent for now.  #5 dehydration: Continue with IV fluids.   DVT Prophylaxis: Lovenox Code Status: Full code Family Communication: Discussed with the patient and his wife  Disposition Plan: Admit to MedSurg   Further management decisions will depend on results of further testing and patient's response to treatment.   Bonnielee Haff  [facility] Pager [phone]  If 7PM-7AM, please contact night-coverage [email or web address] Password [email or web address]  08/03/2013, 2:03 AM  Disclaimer: This note was dictated with voice recognition software. Similar sounding words can inadvertently be transcribed and may not be corrected upon review.

## 2013-08-03 NOTE — Progress Notes (Signed)
Patient admitted to the hospital earlier this morning by Dr. Rito Ehrlich.  Patient seen and examined. Case discussed with Dr. Jena Gauss.  He has been admitted with abdominal pain, nausea, vomiting and diarrhea. He is felt to have a gastroenteritis, possibly viral. CT scan indicated area of the stomach wall and portal vein. This was discussed with Dr. Benard Rink and was felt to be likely a benign phenomenon, possibly from repeated vomiting. Recommendations were to continue intravenous antibiotics for now. Follow up on stool studies. Advancing diet as tolerated.  [employer]

## 2013-08-03 NOTE — ED Provider Notes (Signed)
Change of shift - care accepted from Dr. Freida Busman - pt has CT report showing portal venous air - thought to be related to hepatic infection - no abscess - non toxic on my exam with minimal upper abd ttp.  Persistent n/v.  D/w Dr. Rito Ehrlich who will admit - cipro / flagyl.    Vida Roller, MD 08/03/13 [phone]

## 2013-08-03 NOTE — Consult Note (Signed)
Referring Provider: No ref. provider found Primary Care Physician:  Colette Ribas, MD Primary Gastroenterologist:  Dr.Yanina Knupp  Reason for Consultation:  Air in stomach wall and portal vein  HPI: Jose 43 year old Caucasian Short with type II diabetes mellitus admitted to the hospital yesterday with a two-day history of non-bloody diarrhea, repeated episodes of nonbloody emesis yesterday. CT scan demonstrates air in the gastric wall and multiple air pockets in the portal vein tracking up into the liver likely in the vasculature on the venous side. Otherwise, no evidence of enteritis or colitis. No evidence of perforation or ischemia.  CBC revealed a minimally elevated white count 11.2. Atypical lymphocytes noted. Patient started on IV Cipro and Flagyl over the past 24 hours. He remains nauseated. Not much in way of abdominal pain currently. Stool frequency has settled down. Patient reports last bowel movement was about 4 hours prior to my seeing him this afternoon and he reports there was some form to it.  Patient denies travel recently. He did eat at a family picnic (his son's wedding reception over the weekend). No raw shellfish. He is unaware of anyone else at the picnic became ill. Denies chronic GI problems.  Past Medical History  Diagnosis Date  . Hypertension   . Diabetes mellitus   . Hyperlipidemia     Past Surgical History  Procedure Laterality Date  . Hernia repair      Prior to Admission medications   Medication Sig Start Date End Date Taking? Authorizing Provider  aspirin EC 81 MG tablet Take 81 mg by mouth daily.    Yes Historical Provider, MD  Canagliflozin-Metformin HCl (INVOKAMET) 50-500 MG TABS Take 1 tablet by mouth 2 (two) times daily.   Yes Historical Provider, MD  fish oil-omega-3 fatty acids 1000 MG capsule Take 1 g by mouth daily.     Yes Historical Provider, MD  lisinopril (PRINIVIL,ZESTRIL) 10 MG tablet Take 10 mg by mouth daily.   Yes Historical  Provider, MD  pravastatin (PRAVACHOL) 20 MG tablet Take 20 mg by mouth daily.   Yes Historical Provider, MD  acetaminophen (TYLENOL) 500 MG tablet Take 1,000 mg by mouth as needed. For pain    Historical Provider, MD    Current Facility-Administered Medications  Medication Dose Route Frequency Provider Last Rate Last Dose  . 0.9 %  sodium chloride infusion   Intravenous Continuous Osvaldo Shipper, MD      . acetaminophen (TYLENOL) tablet 650 mg  650 mg Oral Q6H PRN Osvaldo Shipper, MD       Or  . acetaminophen (TYLENOL) suppository 650 mg  650 mg Rectal Q6H PRN Osvaldo Shipper, MD      . ciprofloxacin (CIPRO) IVPB 400 mg  400 mg Intravenous Q12H Osvaldo Shipper, MD   400 mg at 08/03/13 1300  . enoxaparin (LOVENOX) injection 40 mg  40 mg Subcutaneous Q24H Osvaldo Shipper, MD      . HYDROmorphone (DILAUDID) injection 0.5 mg  0.5 mg Intravenous Q4H PRN Osvaldo Shipper, MD   0.5 mg at 08/03/13 0343  . insulin aspart (novoLOG) injection 0-15 Units  0-15 Units Subcutaneous 6 times per day Osvaldo Shipper, MD   2 Units at 08/03/13 1200  . metroNIDAZOLE (FLAGYL) IVPB 500 mg  500 mg Intravenous Q8H Osvaldo Shipper, MD   500 mg at 08/03/13 1610  . ondansetron (ZOFRAN) tablet 4 mg  4 mg Oral Q6H PRN Osvaldo Shipper, MD       Or  . ondansetron [facility]) injection 4 mg  4 mg Intravenous Q6H PRN Osvaldo ShipperGokul Krishnan, MD      . oxyCODONE (Oxy IR/ROXICODONE) immediate release tablet 5 mg  5 mg Oral Q4H PRN Osvaldo ShipperGokul Krishnan, MD      . pantoprazole (PROTONIX) injection 40 mg  40 mg Intravenous Q12H Osvaldo ShipperGokul Krishnan, MD   40 mg at 08/03/13 1000  . promethazine (PHENERGAN) injection 12.5 mg  12.5 mg Intravenous Q6H PRN Osvaldo ShipperGokul Krishnan, MD   12.5 mg at 08/03/13 0433    Allergies as of 08/02/2013 - Review Complete 08/02/2013  Allergen Reaction Noted  . Penicillins Rash 02/18/2008    Family History  Problem Relation Age of Onset  . Diabetes Mother   . Hypertension Mother   . Diabetes Father   . Hypertension Father   . Stroke  Other     History   Social History  . Marital Status: Married    Spouse Name: N/A    Number of Children: N/A  . Years of Education: N/A   Occupational History  . Not on file.   Social History Main Topics  . Smoking status: Former Smoker -- 3 years    Types: Cigarettes    Quit date: 12/10/1988  . Smokeless tobacco: Current User    Types: Snuff  . Alcohol Use: No  . Drug Use: No  . Sexual Activity: Not on file   Other Topics Concern  . Not on file   Social History Narrative  . No narrative on file    Review of Systems: Gen: Denies any , weakness, malaise, weight loss, and sleep disorder CV: Denies chest pain, angina, palpitations, syncope, orthopnea, PND, peripheral edema, and claudication. Resp: Denies dyspnea at rest, dyspnea with exercise, cough, sputum, wheezing, coughing up blood, and pleurisy. GI: Denies vomiting blood, jaundice,.   Denies dysphagia or odynophagia. Derm: Denies rash, itching, dry skin, hives, moles, warts, or unhealing ulcers.  Psych: Denies depression, anxiety, memory loss, suicidal ideation, hallucinations, paranoia, and confusion. Heme: Denies bruising, bleeding, and enlarged lymph nodes.   Physical Exam: Vital signs in last 24 hours: Temp:  [97.2 F (36.2 C)-98.1 F (36.7 C)] 97.2 F (36.2 C) (05/25 1441) Pulse Rate:  [63-81] 63 (05/25 1441) Resp:  [18-20] 20 (05/25 1441) BP: (118-171)/(74-110) 140/86 mmHg (05/25 1441) SpO2:  [96 %-99 %] 98 % (05/25 1441) Weight:  [275 lb (124.739 kg)-281 lb 4.9 oz (127.6 kg)] 281 lb 4.9 oz (127.6 kg) (05/25 0226) Last BM Date: 08/03/13 General:   Alert,  Well-developed, well-nourished, Jose and cooperative in NAD Head:  Normocephalic and atraumatic. Eyes:  Sclera clear, no icterus.   Conjunctiva pink. Ears:  Normal auditory acuity. Nose:  No deformity, discharge,  or lesions. Mouth:  No deformity or lesions, dentition normal. Neck:  Supple; no masses or thyromegaly. Lungs:  Clear throughout to  auscultation.   No wheezes, crackles, or rhonchi. No acute distress. Heart:  Regular rate and rhythm; no murmurs, clicks, rubs,  or gallops. Abdomen:  Nondistended. Positive bowel sounds. Soft and nontender without mass organomegaly Rectal:  Deferred until time of colonoscopy.   Msk:  Symmetrical without gross deformities. Normal posture. Pulses:  Normal pulses noted. Extremities:  Without clubbing or edema.  Intake/Output from previous day: 05/24 0701 - 05/25 0700 In: 440 [I.V.:240; IV Piggyback:200] Out: 1 [Stool:1] Intake/Output this shift: Total I/O In: 180 [P.O.:180] Out: 600 [Urine:600]  Lab Results:  Recent Labs  08/02/13 2205  WBC 11.2*  HGB 15.9  HCT 46.7  PLT 326   BMET  Recent Labs  08/02/13 2310  NA 134*  K 3.9  CL 96  CO2 23  GLUCOSE 161*  BUN 12  CREATININE 0.68  CALCIUM 9.3   LFT  Recent Labs  08/02/13 2310  PROT 7.8  ALBUMIN 3.9  AST 20  ALT 43  ALKPHOS 84  BILITOT 0.3   Studies/Results: Ct Abdomen Pelvis W Contrast  08/03/2013   CLINICAL DATA:  New onset of nausea and diarrhea.  EXAM: CT ABDOMEN AND PELVIS WITH CONTRAST  TECHNIQUE: Multidetector CT imaging of the abdomen and pelvis was performed using the standard protocol following bolus administration of intravenous contrast.  CONTRAST:  27mL OMNIPAQUE IOHEXOL 300 MG/ML SOLN, OMNIPAQUE IOHEXOL 300 MG/ML SOLN  COMPARISON:  CT of the abdomen and pelvis performed 08/14/2011  FINDINGS: The visualized lung bases are clear.  Prominent intrahepatic air is noted, most likely within the portal venous system, predominantly within the right hepatic lobe. Surrounding vaguely decreased attenuation is seen involving the hepatic parenchyma, raising suspicion for underlying infection of uncertain etiology. There is no evidence of abscess formation at this time.  A small amount of air is noted within the venous vasculature surrounding the stomach, and portal venous gas is thought most likely to reflect  extension of benign gastric emphysema. There is no evidence of gastric wall thickening or irregularity to suggest emphysematous gastritis. The etiology of the apparent infection within the liver is unclear.  The gallbladder is within normal limits. The pancreas and adrenal glands are unremarkable.  The kidneys are unremarkable in appearance. There is no evidence of hydronephrosis. No renal or ureteral stones are seen. Minimal nonspecific perinephric stranding is noted bilaterally.  The anterior abdominal wall mesh is noted on the left side, with a small periumbilical hernia again noted just to the right of midline, adjacent to the edge of the mesh.  No free fluid is identified. The small bowel is unremarkable in appearance. The stomach is within normal limits. No acute vascular abnormalities are seen.  The appendix is normal in caliber, without evidence for appendicitis. The colon is unremarkable in appearance.  The bladder is mildly distended and grossly unremarkable. The prostate remains normal in size. No inguinal lymphadenopathy is seen.  No acute osseous abnormalities are identified.  IMPRESSION: 1. Prominent intrahepatic air, most likely in the portal venous system, predominantly in the right hepatic lobe. Surrounding vaguely decreased periportal parenchymal attenuation raises suspicion for underlying hepatic infection of uncertain etiology. 2. Small amount of air within the venous vasculature surrounding the stomach; the portal venous gas is thought most likely to reflect extension of benign gastric emphysema. No evidence to suggest emphysematous gastritis. This does not explain the apparent signs of infection within the liver. 3. Stable small periumbilical hernia noted just to the right of midline, adjacent to the edge of the patient's anterior abdominal wall mesh.   Electronically Signed   By: Roanna Raider M.D.   On: 08/03/2013 01:18    Impression:  Jose 43 year old gentleman admitted to the  hospital with acute onset of nausea, vomiting and nonbloody diarrhea. Mild leukocytosis-atypical lymphocytes.  Patient has gastric pneumatosis and air in the portal venous system tracking up into the liver. I personally reviewed the images with Dr. Jena Gauss today. I suspect the patient has  translocation of air from the gastric lumen into the gastric vasculature on the venous side which tracked into the liver  -  Likely a benign "bystander" phenomenon.  GI pathogen panel pending I suspect the patient has a viral gastroenteritis. He  has already been started on IV Cipro and Flagyl  Recommendations: Continue supportive care. Followup on GI pathogen panel. Would consider wrapping up antibiotic therapy With a total of 5 to 7 day course pending review of GI pathogen panel. Gastric pneumatosis needs no further attention. Discussed with Dr. Kerry Hough.  Will follow with you.     Notice:  This dictation was prepared with Dragon dictation along with smaller phrase technology. Any transcriptional errors that result from this process are unintentional and may not be corrected upon review.

## 2013-08-03 NOTE — ED Notes (Signed)
Vomited all of the contrast, dr Hyacinth Meeker aware, we will do ct scan now

## 2013-08-03 NOTE — ED Notes (Signed)
Patient drinking contrast waiting to go to CT at this time.

## 2013-08-04 LAB — CBC
HEMATOCRIT: 42.5 % (ref 39.0–52.0)
HEMOGLOBIN: 13.8 g/dL (ref 13.0–17.0)
MCH: 26.8 pg (ref 26.0–34.0)
MCHC: 32.5 g/dL (ref 30.0–36.0)
MCV: 82.7 fL (ref 78.0–100.0)
Platelets: 288 10*3/uL (ref 150–400)
RBC: 5.14 MIL/uL (ref 4.22–5.81)
RDW: 13.5 % (ref 11.5–15.5)
WBC: 5 10*3/uL (ref 4.0–10.5)

## 2013-08-04 LAB — COMPREHENSIVE METABOLIC PANEL
ALBUMIN: 3.1 g/dL — AB (ref 3.5–5.2)
ALK PHOS: 64 U/L (ref 39–117)
ALT: 35 U/L (ref 0–53)
AST: 22 U/L (ref 0–37)
BUN: 7 mg/dL (ref 6–23)
CALCIUM: 8.6 mg/dL (ref 8.4–10.5)
CO2: 27 mEq/L (ref 19–32)
Chloride: 102 mEq/L (ref 96–112)
Creatinine, Ser: 0.8 mg/dL (ref 0.50–1.35)
GFR calc Af Amer: >90 mL/min (ref >90)
GFR calc non Af Amer: >90 mL/min (ref >90)
Glucose, Bld: 167 mg/dL — ABNORMAL HIGH (ref 70–99)
Potassium: 4.2 mEq/L (ref 3.7–5.3)
SODIUM: 140 meq/L (ref 137–147)
TOTAL PROTEIN: 6.5 g/dL (ref 6.0–8.3)
Total Bilirubin: 0.3 mg/dL (ref 0.3–1.2)

## 2013-08-04 LAB — GI PATHOGEN PANEL BY PCR, STOOL
C DIFFICILE TOXIN A/B: NEGATIVE
CAMPYLOBACTER BY PCR: NEGATIVE
Cryptosporidium by PCR: NEGATIVE
E coli (ETEC) LT/ST: NEGATIVE
E coli (STEC): NEGATIVE
E coli 0157 by PCR: NEGATIVE
G LAMBLIA BY PCR: NEGATIVE
Norovirus GI/GII: NEGATIVE
ROTAVIRUS A BY PCR: POSITIVE
SALMONELLA BY PCR: NEGATIVE
Shigella by PCR: NEGATIVE

## 2013-08-04 LAB — GLUCOSE, CAPILLARY
GLUCOSE-CAPILLARY: 161 mg/dL — AB (ref 70–99)
GLUCOSE-CAPILLARY: 138 mg/dL — AB (ref 70–99)
GLUCOSE-CAPILLARY: 161 mg/dL — AB (ref 70–99)
Glucose-Capillary: 117 mg/dL — ABNORMAL HIGH (ref 70–99)
Glucose-Capillary: 195 mg/dL — ABNORMAL HIGH (ref 70–99)

## 2013-08-04 LAB — HEPATITIS PANEL, ACUTE
HCV Ab: NEGATIVE
HEP B C IGM: NONREACTIVE
Hep A IgM: NONREACTIVE
Hepatitis B Surface Ag: NEGATIVE

## 2013-08-04 LAB — HEMOGLOBIN A1C
HEMOGLOBIN A1C: 12.7 % — AB (ref <5.7)
MEAN PLASMA GLUCOSE: 318 mg/dL — AB (ref <117)

## 2013-08-04 LAB — TSH: TSH: 0.636 u[IU]/mL (ref 0.350–4.500)

## 2013-08-04 MED ORDER — PANTOPRAZOLE SODIUM 40 MG PO TBEC: 40.0000 mg | DELAYED_RELEASE_TABLET | Freq: Every day | ORAL | Status: DC

## 2013-08-04 MED ORDER — METRONIDAZOLE 500 MG PO TABS: 500.0000 mg | ORAL_TABLET | Freq: Three times a day (TID) | ORAL | Status: DC

## 2013-08-04 MED ORDER — CANAGLIFLOZIN-METFORMIN HCL 50-500 MG PO TABS: 1.0000 | ORAL_TABLET | Freq: Two times a day (BID) | ORAL | Status: DC

## 2013-08-04 MED ORDER — PANTOPRAZOLE SODIUM 40 MG PO TBEC
40.0000 mg | DELAYED_RELEASE_TABLET | Freq: Two times a day (BID) | ORAL | Status: DC
  Administered 2013-08-04: 40 mg via ORAL
  Filled 2013-08-04: qty 1

## 2013-08-04 MED ORDER — CIPROFLOXACIN HCL 500 MG PO TABS: 500.0000 mg | ORAL_TABLET | Freq: Two times a day (BID) | ORAL | Status: DC

## 2013-08-04 MED ORDER — ONDANSETRON HCL 4 MG PO TABS: 4.0000 mg | ORAL_TABLET | Freq: Four times a day (QID) | ORAL | Status: DC | PRN

## 2013-08-04 MED ORDER — OXYCODONE HCL 5 MG PO TABS: 5.0000 mg | ORAL_TABLET | Freq: Four times a day (QID) | ORAL | Status: DC | PRN

## 2013-08-04 NOTE — Discharge Summary (Signed)
Physician Discharge Summary  Jose Short DGL:[id number] DOB: 08-Jun-1970 DOA: 08/02/2013  PCP: Colette Ribas, MD  Admit date: 08/02/2013 Discharge date: 08/04/2013  Time spent: 45 minutes  Recommendations for Outpatient Follow-up:  1. Followup primary care physician in one to 2 weeks  Discharge Diagnoses:  Principal Problem:   Nausea vomiting and diarrhea, possibly viral gastroenteritis Active Problems:   Abnormal CT of the abdomen   DM type 2 (diabetes mellitus, type 2)   HTN (hypertension), benign   Hyperlipidemia   Discharge Condition: improved  Diet recommendation: low carb, low salt  Filed Weights   08/02/13 2121 08/03/13 0226  Weight: 124.739 kg (275 lb) 127.6 kg (281 lb 4.9 oz)    History of present illness:  Jose Short is a 43 y.o. male with a past medical history of hypertension, diabetes mellitus type II, hyperlipidemia, who was in his usual state of health till about 5:30 this morning when he started having diarrhea. He describes is as watery stool without blood and light brown in color. He's had abdominal cramping as a result of diarrhea. Denied any abdominal pain, per se. He took 3 Imodium as at home with no relief. He came to the emergency department and was given more Imodium without any relief. He was nauseous at home, but had not vomited. After receiving morphine in the hospital he started having vomiting as well. No blood in emesis. Denies any fever, chills recently. No urinary complaints. Denies any sick contacts or recent travel. He did have a get-together yesterday, where he made hotdogs and had potato salad. Had food that other people that brought in as well. Denies history of colonoscopy.   Hospital Course:  This patient was admitted to the hospital with abdominal pain, diarrhea. He was found to have a possible gastroenteritis. CT scan of the abdomen indicated gastric pneumatosis and air in the portal venous system tracking up into the liver.  Gastroenterology consulted on the patient and reviewed the films with radiology. It was felt that the patient had translocation of air from the gastric lumen into the gastric vasculature on the venous side which tracks to the liver. This was felt to be a benign phenomenon. No further attention to gastric pneumatosis would be needed. GI pathogen panel was sent and is currently pending. Clinically, the patient feels improved. He is now tolerating a solid diet. Diarrhea appears to be improving. He will complete a total of 7 days of antibiotics (cipro/flagyl). The patient is stable for discharge home.  Procedures:    Consultations:  Gastroenterology  Discharge Exam: Filed Vitals:   08/04/13 1330  BP: 117/71  Pulse: 61  Temp: 97.9 F (36.6 C)  Resp: 20    General: NAd Cardiovascular: S1, S2 RRR Respiratory: cta b Abd: soft, mild tenderness in upper abdomen, bs+  Discharge Instructions You were cared for by a hospitalist during your hospital stay. If you have any questions about your discharge medications or the care you received while you were in the hospital after you are discharged, you can call the unit and asked to speak with the hospitalist on call if the hospitalist that took care of you is not available. Once you are discharged, your primary care physician will handle any further medical issues. Please note that NO REFILLS for any discharge medications will be authorized once you are discharged, as it is imperative that you return to your primary care physician (or establish a relationship with a primary care physician if you do not  have one) for your aftercare needs so that they can reassess your need for medications and monitor your lab values.  Discharge Instructions   Call MD for:  persistant nausea and vomiting    Complete by:  As directed      Call MD for:  severe uncontrolled pain    Complete by:  As directed      Call MD for:  temperature >100.4    Complete by:  As directed       Diet - low sodium heart healthy    Complete by:  As directed      Increase activity slowly    Complete by:  As directed             Medication List         acetaminophen 500 MG tablet  Commonly known as:  TYLENOL  Take 1,000 mg by mouth as needed. For pain     aspirin EC 81 MG tablet  Take 81 mg by mouth daily.     Canagliflozin-Metformin HCl 50-500 MG Tabs  Commonly known as:  INVOKAMET  Take 1 tablet by mouth 2 (two) times daily. Restart after 2 days and take with food     ciprofloxacin 500 MG tablet  Commonly known as:  CIPRO  Take 1 tablet (500 mg total) by mouth 2 (two) times daily.     fish oil-omega-3 fatty acids 1000 MG capsule  Take 1 g by mouth daily.     lisinopril 10 MG tablet  Commonly known as:  PRINIVIL,ZESTRIL  Take 10 mg by mouth daily.     metroNIDAZOLE 500 MG tablet  Commonly known as:  FLAGYL  Take 1 tablet (500 mg total) by mouth 3 (three) times daily.     ondansetron 4 MG tablet  Commonly known as:  ZOFRAN  Take 1 tablet (4 mg total) by mouth every 6 (six) hours as needed for nausea.     oxyCODONE 5 MG immediate release tablet  Commonly known as:  Oxy IR/ROXICODONE  Take 1 tablet (5 mg total) by mouth every 6 (six) hours as needed for moderate pain.     pantoprazole 40 MG tablet  Commonly known as:  PROTONIX  Take 1 tablet (40 mg total) by mouth daily.     pravastatin 20 MG tablet  Commonly known as:  PRAVACHOL  Take 20 mg by mouth daily.       Allergies  Allergen Reactions  . Penicillins Rash       Follow-up Information   Follow up with Colette Ribas, MD. Schedule an appointment as soon as possible for a visit in 2 weeks.   Specialty:  Family Medicine   Contact information:   [address] [place] [address] [phone]        The results of significant diagnostics from this hospitalization (including imaging, microbiology, ancillary and laboratory) are listed below for reference.    Significant  Diagnostic Studies: Ct Abdomen Pelvis W Contrast  08/03/2013   CLINICAL DATA:  New onset of nausea and diarrhea.  EXAM: CT ABDOMEN AND PELVIS WITH CONTRAST  TECHNIQUE: Multidetector CT imaging of the abdomen and pelvis was performed using the standard protocol following bolus administration of intravenous contrast.  CONTRAST:  59mL OMNIPAQUE IOHEXOL 300 MG/ML SOLN, OMNIPAQUE IOHEXOL 300 MG/ML SOLN  COMPARISON:  CT of the abdomen and pelvis performed 08/14/2011  FINDINGS: The visualized lung bases are clear.  Prominent intrahepatic air is noted, most likely within the portal  venous system, predominantly within the right hepatic lobe. Surrounding vaguely decreased attenuation is seen involving the hepatic parenchyma, raising suspicion for underlying infection of uncertain etiology. There is no evidence of abscess formation at this time.  A small amount of air is noted within the venous vasculature surrounding the stomach, and portal venous gas is thought most likely to reflect extension of benign gastric emphysema. There is no evidence of gastric wall thickening or irregularity to suggest emphysematous gastritis. The etiology of the apparent infection within the liver is unclear.  The gallbladder is within normal limits. The pancreas and adrenal glands are unremarkable.  The kidneys are unremarkable in appearance. There is no evidence of hydronephrosis. No renal or ureteral stones are seen. Minimal nonspecific perinephric stranding is noted bilaterally.  The anterior abdominal wall mesh is noted on the left side, with a small periumbilical hernia again noted just to the right of midline, adjacent to the edge of the mesh.  No free fluid is identified. The small bowel is unremarkable in appearance. The stomach is within normal limits. No acute vascular abnormalities are seen.  The appendix is normal in caliber, without evidence for appendicitis. The colon is unremarkable in appearance.  The bladder is mildly  distended and grossly unremarkable. The prostate remains normal in size. No inguinal lymphadenopathy is seen.  No acute osseous abnormalities are identified.  IMPRESSION: 1. Prominent intrahepatic air, most likely in the portal venous system, predominantly in the right hepatic lobe. Surrounding vaguely decreased periportal parenchymal attenuation raises suspicion for underlying hepatic infection of uncertain etiology. 2. Small amount of air within the venous vasculature surrounding the stomach; the portal venous gas is thought most likely to reflect extension of benign gastric emphysema. No evidence to suggest emphysematous gastritis. This does not explain the apparent signs of infection within the liver. 3. Stable small periumbilical hernia noted just to the right of midline, adjacent to the edge of the patient's anterior abdominal wall mesh.   Electronically Signed   By: Roanna Raider M.D.   On: 08/03/2013 01:18    Microbiology: Recent Results (from the past 240 hour(s))  STOOL CULTURE     Status: None   Collection Time    08/02/13 10:05 PM      Result Value Ref Range Status   Specimen Description STOOL   Final   Special Requests NONE   Final   Culture     Final   Value: Culture reincubated for better growth     Performed at [facility]   Report Status PENDING   Incomplete     Labs: Basic Metabolic Panel:  Recent Labs Lab 08/02/13 2310 08/04/13 0555  NA 134* 140  K 3.9 4.2  CL 96 102  CO2 23 27  GLUCOSE 161* 167*  BUN 12 7  CREATININE 0.68 0.80  CALCIUM 9.3 8.6   Liver Function Tests:  Recent Labs Lab 08/02/13 2310 08/04/13 0555  AST 20 22  ALT 43 35  ALKPHOS 84 64  BILITOT 0.3 0.3  PROT 7.8 6.5  ALBUMIN 3.9 3.1*    Recent Labs Lab 08/02/13 2310  LIPASE 25   No results found for this basename: AMMONIA,  in the last 168 hours CBC:  Recent Labs Lab 08/02/13 2205 08/04/13 0555  WBC 11.2* 5.0  NEUTROABS 7.9*  --   HGB 15.9 13.8  HCT 46.7 42.5   MCV 81.5 82.7  PLT 326 288   Cardiac Enzymes: No results found for this basename: CKTOTAL, CKMB,  CKMBINDEX, TROPONINI,  in the last 168 hours BNP: BNP (last 3 results) No results found for this basename: PROBNP,  in the last 8760 hours CBG:  Recent Labs Lab 08/04/13 0122 08/04/13 0435 08/04/13 0800 08/04/13 1111 08/04/13 1701  GLUCAP 117* 161* 138* 161* 195*       Signed:  Jordanna Hendrie  [facility] 08/04/2013, 7:08 PM

## 2013-08-04 NOTE — Progress Notes (Signed)
Spoke with patient and his wife about diabetes and home regimen for diabetes control. Patient reports that he is followed by his PCP for diabetes management and currently he takes Invokamet 50-500 mg BID for diabetes control. Patient states that he was previously on Metformin for diabetes and he stopped taking any of his medications for about 2-3 months prior to May of this year due to lack of insurance and cost of doctor visits and medications.  Patient reports that he went to his PCP on Jul 23, 2013 and was prescribed Invokamet 50-500 mg daily which he started on Jul 24, 2013.  Patient reports that he followed up with his PCP by phone and was told to increase Invokamet to BID which he has done.  According to the patient his blood glucose was running HI on his glucometer prior to going to the doctor on 07/23/13 and since he has been taking Invokamet his blood sugar has been reading 300-400's mg/dl. Inquired about knowledge about A1C and patient reports that he does not know what an A1C is but his PCP said his "gluocse level was greater than 14% on 07/23/13". Discussed A1C results (12.7%% on 08/02/13) and explained what an A1C is, basic pathophysiology of DM Type 2, basic home care, importance of checking CBGs and maintaining good CBG control to prevent long-term and short-term complications. Discussed impact of nutrition, exercise, stress, sickness, and medications on diabetes control.  Discussed carbohydrates, carbohydrate goals per day and meal, along with portion sizes.  Provided written and oral information on free outpatient diabetes education class at [facility] and encouraged patient and his family to attend one of the classes.  Patient reported that his father has diabetes and takes oral medications as well as insulin for diabetes control.  Discussed insulin injections with the patient and explained basal/bolus insulin.  Patient states that his PCP has asked that he try the Invokamet for 3 months and if his  A1C is not greatly improved then he will likely need to go on insulin injections. Patient verbalized understanding of information discussed and he states that he has no further questions at this time related to diabetes.   Thanks, Orlando Penner, RN, MSN, CCRN Diabetes Coordinator Inpatient Diabetes Program [phone] (Team Pager) [phone] ([place] office) [phone] [facility] office)

## 2013-08-04 NOTE — Care Management Utilization Note (Signed)
UR completed 

## 2013-08-04 NOTE — Progress Notes (Signed)
Subjective: Feels improved overall. Cramping pain when stomach growls. Upper abdominal cramping. Diarrhea improving, forming back up. Last BM yesterday evening. Nausea improved. No rectal bleeding.   Objective: Vital signs in last 24 hours: Temp:  [97.2 F (36.2 C)-98.1 F (36.7 C)] 98.1 F (36.7 C) (05/26 0437) Pulse Rate:  [61-65] 61 (05/26 0437) Resp:  [18-20] 18 (05/26 0437) BP: (102-140)/(66-86) 130/86 mmHg (05/26 0437) SpO2:  [95 %-98 %] 96 % (05/26 0437) Last BM Date: 08/03/13 General:   Alert and oriented, pleasant Head:  Normocephalic and atraumatic. Abdomen:  Bowel sounds present, soft, non-distended. No significant tenderness. Query umbilical hernia   Neurologic:  Alert and  oriented x4;  grossly normal neurologically. Skin:  Warm and dry, intact without significant lesions.  Psych:  Alert and cooperative. Normal mood and affect.  Intake/Output from previous day: 05/25 0701 - 05/26 0700 In: 1238.8 [P.O.:180; I.V.:658.8; IV Piggyback:400] Out: 603 [Urine:603] Intake/Output this shift:    Lab Results:  Recent Labs  08/02/13 2205 08/04/13 0555  WBC 11.2* 5.0  HGB 15.9 13.8  HCT 46.7 42.5  PLT 326 288   BMET  Recent Labs  08/02/13 2310 08/04/13 0555  NA 134* 140  K 3.9 4.2  CL 96 102  CO2 23 27  GLUCOSE 161* 167*  BUN 12 7  CREATININE 0.68 0.80  CALCIUM 9.3 8.6   LFT  Recent Labs  08/02/13 2310 08/04/13 0555  PROT 7.8 6.5  ALBUMIN 3.9 3.1*  AST 20 22  ALT 43 35  ALKPHOS 84 64  BILITOT 0.3 0.3   Hepatitis Panel  Recent Labs  08/02/13 2205  HEPBSAG NEGATIVE  HCVAB NEGATIVE  HEPAIGM NON REACTIVE  HEPBIGM NON REACTIVE   GI pathogen panel pending.   Studies/Results: Ct Abdomen Pelvis W Contrast  08/03/2013   CLINICAL DATA:  New onset of nausea and diarrhea.  EXAM: CT ABDOMEN AND PELVIS WITH CONTRAST  TECHNIQUE: Multidetector CT imaging of the abdomen and pelvis was performed using the standard protocol following bolus  administration of intravenous contrast.  CONTRAST:  45mL OMNIPAQUE IOHEXOL 300 MG/ML SOLN, OMNIPAQUE IOHEXOL 300 MG/ML SOLN  COMPARISON:  CT of the abdomen and pelvis performed 08/14/2011  FINDINGS: The visualized lung bases are clear.  Prominent intrahepatic air is noted, most likely within the portal venous system, predominantly within the right hepatic lobe. Surrounding vaguely decreased attenuation is seen involving the hepatic parenchyma, raising suspicion for underlying infection of uncertain etiology. There is no evidence of abscess formation at this time.  A small amount of air is noted within the venous vasculature surrounding the stomach, and portal venous gas is thought most likely to reflect extension of benign gastric emphysema. There is no evidence of gastric wall thickening or irregularity to suggest emphysematous gastritis. The etiology of the apparent infection within the liver is unclear.  The gallbladder is within normal limits. The pancreas and adrenal glands are unremarkable.  The kidneys are unremarkable in appearance. There is no evidence of hydronephrosis. No renal or ureteral stones are seen. Minimal nonspecific perinephric stranding is noted bilaterally.  The anterior abdominal wall mesh is noted on the left side, with a small periumbilical hernia again noted just to the right of midline, adjacent to the edge of the mesh.  No free fluid is identified. The small bowel is unremarkable in appearance. The stomach is within normal limits. No acute vascular abnormalities are seen.  The appendix is normal in caliber, without evidence for appendicitis. The colon is  unremarkable in appearance.  The bladder is mildly distended and grossly unremarkable. The prostate remains normal in size. No inguinal lymphadenopathy is seen.  No acute osseous abnormalities are identified.  IMPRESSION: 1. Prominent intrahepatic air, most likely in the portal venous system, predominantly in the right hepatic  lobe. Surrounding vaguely decreased periportal parenchymal attenuation raises suspicion for underlying hepatic infection of uncertain etiology. 2. Small amount of air within the venous vasculature surrounding the stomach; the portal venous gas is thought most likely to reflect extension of benign gastric emphysema. No evidence to suggest emphysematous gastritis. This does not explain the apparent signs of infection within the liver. 3. Stable small periumbilical hernia noted just to the right of midline, adjacent to the edge of the patient's anterior abdominal wall mesh.   Electronically Signed   By: Roanna RaiderJeffery  Chang M.D.   On: 08/03/2013 01:18    Assessment: 43 year old male admitted with likely viral gastroenteritis, symptomatically improving since admission with empiric antibiotics. Bystander phenomenon of gastric pneumatosis and air in portal venous system tracking up into liver without need for further work-up.   Plan: Advance to low residue diet Transition to oral Cipro and Flagyl if tolerates lunch Complete 7 day course of empiric abx therapy Follow-up on pending GI pathogen panel Hopeful discharge in next 24 hours   Nira RetortAnna W. Sams, ANP-BC [facility]    LOS: 2 days    08/04/2013, 9:10 AM

## 2013-08-04 NOTE — Discharge Instructions (Signed)
Viral Gastroenteritis Viral gastroenteritis is also known as stomach flu. This condition affects the stomach and intestinal tract. It can cause sudden diarrhea and vomiting. The illness typically lasts 3 to 8 days. Most people develop an immune response that eventually gets rid of the virus. While this natural response develops, the virus can make you quite ill. CAUSES  Many different viruses can cause gastroenteritis, such as rotavirus or noroviruses. You can catch one of these viruses by consuming contaminated food or water. You may also catch a virus by sharing utensils or other personal items with an infected person or by touching a contaminated surface. SYMPTOMS  The most common symptoms are diarrhea and vomiting. These problems can cause a severe loss of body fluids (dehydration) and a body salt (electrolyte) imbalance. Other symptoms may include:  Fever.  Headache.  Fatigue.  Abdominal pain. DIAGNOSIS  Your caregiver can usually diagnose viral gastroenteritis based on your symptoms and a physical exam. A stool sample may also be taken to test for the presence of viruses or other infections. TREATMENT  This illness typically goes away on its own. Treatments are aimed at rehydration. The most serious cases of viral gastroenteritis involve vomiting so severely that you are not able to keep fluids down. In these cases, fluids must be given through an intravenous line (IV). HOME CARE INSTRUCTIONS   Drink enough fluids to keep your urine clear or pale yellow. Drink small amounts of fluids frequently and increase the amounts as tolerated.  Ask your caregiver for specific rehydration instructions.  Avoid:  Foods high in sugar.  Alcohol.  Carbonated drinks.  Tobacco.  Juice.  Caffeine drinks.  Extremely hot or cold fluids.  Fatty, greasy foods.  Too much intake of anything at one time.  Dairy products until 24 to 48 hours after diarrhea stops.  You may consume probiotics.  Probiotics are active cultures of beneficial bacteria. They may lessen the amount and number of diarrheal stools in adults. Probiotics can be found in yogurt with active cultures and in supplements.  Wash your hands well to avoid spreading the virus.  Only take over-the-counter or prescription medicines for pain, discomfort, or fever as directed by your caregiver. Do not give aspirin to children. Antidiarrheal medicines are not recommended.  Ask your caregiver if you should continue to take your regular prescribed and over-the-counter medicines.  Keep all follow-up appointments as directed by your caregiver. SEEK IMMEDIATE MEDICAL CARE IF:   You are unable to keep fluids down.  You do not urinate at least once every 6 to 8 hours.  You develop shortness of breath.  You notice blood in your stool or vomit. This may look like coffee grounds.  You have abdominal pain that increases or is concentrated in one small area (localized).  You have persistent vomiting or diarrhea.  You have a fever.  The patient is a child younger than 3 months, and he or she has a fever.  The patient is a child older than 3 months, and he or she has a fever and persistent symptoms.  The patient is a child older than 3 months, and he or she has a fever and symptoms suddenly get worse.  The patient is a baby, and he or she has no tears when crying. MAKE SURE YOU:   Understand these instructions.  Will watch your condition.  Will get help right away if you are not doing well or get worse. Document Released: 02/26/2005 Document Revised: 05/21/2011 Document Reviewed: 12/13/2010   [employer] Patient Information 2014 [employer].  

## 2013-08-04 NOTE — Progress Notes (Signed)
REVIEWED. CHANGE PROTONIX TO PO.

## 2013-08-07 ENCOUNTER — Other Ambulatory Visit (HOSPITAL_COMMUNITY): Payer: Self-pay | Admitting: Orthopaedic Surgery

## 2013-08-07 DIAGNOSIS — M25562 Pain in left knee: Secondary | ICD-10-CM

## 2013-08-07 LAB — STOOL CULTURE

## 2013-08-13 ENCOUNTER — Ambulatory Visit (HOSPITAL_COMMUNITY)
Admission: RE | Admit: 2013-08-13 | Discharge: 2013-08-13 | Disposition: A | Discharge status: Home / Self Care | Payer: BC Managed Care – PPO | Source: Ambulatory Visit | Attending: Orthopaedic Surgery | Admitting: Orthopaedic Surgery

## 2013-08-13 DIAGNOSIS — M2390 Unspecified internal derangement of unspecified knee: Secondary | ICD-10-CM | POA: Insufficient documentation

## 2013-08-13 DIAGNOSIS — M25562 Pain in left knee: Secondary | ICD-10-CM

## 2013-11-16 ENCOUNTER — Emergency Department (HOSPITAL_COMMUNITY): Payer: BC Managed Care – PPO

## 2013-11-16 ENCOUNTER — Encounter (HOSPITAL_COMMUNITY): Payer: Self-pay | Admitting: Emergency Medicine

## 2013-11-16 ENCOUNTER — Emergency Department (HOSPITAL_COMMUNITY)
Admission: EM | Admit: 2013-11-16 | Discharge: 2013-11-16 | Disposition: A | Discharge status: Home / Self Care | Payer: BC Managed Care – PPO | Attending: Emergency Medicine | Admitting: Emergency Medicine

## 2013-11-16 DIAGNOSIS — Y9367 Activity, basketball: Secondary | ICD-10-CM | POA: Insufficient documentation

## 2013-11-16 DIAGNOSIS — Y9239 Other specified sports and athletic area as the place of occurrence of the external cause: Secondary | ICD-10-CM | POA: Insufficient documentation

## 2013-11-16 DIAGNOSIS — Y92838 Other recreation area as the place of occurrence of the external cause: Secondary | ICD-10-CM | POA: Diagnosis not present

## 2013-11-16 DIAGNOSIS — I10 Essential (primary) hypertension: Secondary | ICD-10-CM | POA: Diagnosis not present

## 2013-11-16 DIAGNOSIS — Z7982 Long term (current) use of aspirin: Secondary | ICD-10-CM | POA: Diagnosis not present

## 2013-11-16 DIAGNOSIS — Z880 Allergy status to penicillin: Secondary | ICD-10-CM | POA: Diagnosis not present

## 2013-11-16 DIAGNOSIS — W219XXA Striking against or struck by unspecified sports equipment, initial encounter: Secondary | ICD-10-CM | POA: Insufficient documentation

## 2013-11-16 DIAGNOSIS — IMO0002 Reserved for concepts with insufficient information to code with codable children: Secondary | ICD-10-CM | POA: Diagnosis not present

## 2013-11-16 DIAGNOSIS — Z87891 Personal history of nicotine dependence: Secondary | ICD-10-CM | POA: Insufficient documentation

## 2013-11-16 DIAGNOSIS — Z79899 Other long term (current) drug therapy: Secondary | ICD-10-CM | POA: Insufficient documentation

## 2013-11-16 DIAGNOSIS — E785 Hyperlipidemia, unspecified: Secondary | ICD-10-CM | POA: Insufficient documentation

## 2013-11-16 DIAGNOSIS — E119 Type 2 diabetes mellitus without complications: Secondary | ICD-10-CM | POA: Diagnosis not present

## 2013-11-16 DIAGNOSIS — M25569 Pain in unspecified knee: Secondary | ICD-10-CM | POA: Diagnosis present

## 2013-11-16 DIAGNOSIS — S8391XA Sprain of unspecified site of right knee, initial encounter: Secondary | ICD-10-CM

## 2013-11-16 MED ORDER — HYDROCODONE-ACETAMINOPHEN 5-325 MG PO TABS: ORAL_TABLET | ORAL | Status: DC

## 2013-11-16 MED ORDER — HYDROCODONE-ACETAMINOPHEN 5-325 MG PO TABS
2.0000 | ORAL_TABLET | Freq: Once | ORAL | Status: AC
  Administered 2013-11-16: 2 via ORAL
  Filled 2013-11-16: qty 2

## 2013-11-16 NOTE — ED Provider Notes (Signed)
Medical screening examination/treatment/procedure(s) were performed by non-physician practitioner and as supervising physician I was immediately available for consultation/collaboration.    Linwood Dibbles, MD 11/16/13 2236

## 2013-11-16 NOTE — ED Notes (Signed)
Loney Laurence, PAC in to assess.

## 2013-11-16 NOTE — ED Provider Notes (Signed)
CSN: [id number]     Arrival date & time 11/16/13  1824 History   First MD Initiated Contact with Patient 11/16/13 1905   This chart was scribed for non-physician practitioner Ivery Quale, PA-C,  working with Linwood Dibbles, MD by Gwenevere Abbot, ED scribe. This patient was seen in room APFT21/APFT[facility] and the patient's care was started at 7:50 PM.    Chief Complaint  Patient presents with  . Knee Pain    RT    Patient is a 43 y.o. male presenting with knee pain. The history is provided by the patient. No language interpreter was used.  Knee Pain Location:  Knee Knee location:  R knee Pain details:    Quality:  Aching   Severity:  Severe   Timing:  Constant Dislocation: no   Foreign body present:  No foreign bodies  HPI Comments:  Jose Short is a 43 y.o. male who presents to the Emergency Department complaining of right knee pain, onset 2 days ago. Pt reports that he was playing basketball, and tripped over uneven area. Pt reports that he landed on his back, but he believed that he was fine. He states that he went to work later, then took a break and experienced extreme stiffness in the right knee after sitting. Pt reports that he ambulates with a cain and experiences extreme discomfort. Pt also feels as if he is going to fall without assistance. Pt reports that he has arthritis in the left knee.   Past Medical History  Diagnosis Date  . Hypertension   . Diabetes mellitus   . Hyperlipidemia    Past Surgical History  Procedure Laterality Date  . Hernia repair     Family History  Problem Relation Age of Onset  . Diabetes Mother   . Hypertension Mother   . Diabetes Father   . Hypertension Father   . Stroke Other    History  Substance Use Topics  . Smoking status: Former Smoker -- 3 years    Types: Cigarettes    Quit date: 12/10/1988  . Smokeless tobacco: Current User    Types: Snuff  . Alcohol Use: No    Review of Systems  Musculoskeletal: Positive for arthralgias, joint  swelling and myalgias.  All other systems reviewed and are negative.     Allergies  Penicillins  Home Medications   Prior to Admission medications   Medication Sig Start Date End Date Taking? Authorizing Provider  aspirin EC 325 MG tablet Take 325 mg by mouth daily.   Yes Historical Provider, MD  Canagliflozin-Metformin HCl (INVOKAMET) 50-500 MG TABS Take 1 tablet by mouth 2 (two) times daily. Restart after 2 days and take with food 08/04/13  Yes Erick Blinks, MD  fish oil-omega-3 fatty acids 1000 MG capsule Take 1 g by mouth daily.     Yes Historical Provider, MD  HYDROcodone-acetaminophen (NORCO) 7.5-325 MG per tablet Take 1 tablet by mouth every 6 (six) hours as needed. For pain 11/05/13  Yes Historical Provider, MD  lisinopril (PRINIVIL,ZESTRIL) 10 MG tablet Take 10 mg by mouth daily.   Yes Historical Provider, MD  naproxen (NAPROSYN) 500 MG tablet Take 500 mg by mouth 2 (two) times daily. 10/31/13  Yes Historical Provider, MD  pravastatin (PRAVACHOL) 20 MG tablet Take 20 mg by mouth daily.   Yes Historical Provider, MD  acetaminophen (TYLENOL) 500 MG tablet Take 1,000 mg by mouth as needed. For pain    Historical Provider, MD   BP 121/75  Pulse  77  Temp(Src) 98 F (36.7 C) (Oral)  Resp 18  Ht  (1.88 m)  Wt 280 lb (127.007 kg)  BMI 35.93 kg/m2  SpO2 100% Physical Exam  Nursing note and vitals reviewed. Constitutional: He is oriented to person, place, and time. He appears well-developed and well-nourished.  HENT:  Head: Normocephalic and atraumatic.  Eyes: EOM are normal.  Neck: Normal range of motion. Neck supple.  Cardiovascular: Normal rate and regular rhythm.   Pulmonary/Chest: Effort normal.  Musculoskeletal: Normal range of motion.  No quadricept deformity of the right knee. Patella is midline. Mild increased warmth of tibial tuberosity. No posterior mass. No tib/fib deformity. Small effusion present of the right knee. Pt has good ROM of the right ankle. Full ROM  of right toes.   Neurological: He is alert and oriented to person, place, and time.  Skin: Skin is warm and dry.  Psychiatric: He has a normal mood and affect. His behavior is normal.    ED Course  Procedures  DIAGNOSTIC STUDIES: Oxygen Saturation is 100% on RA, normal by my interpretation.  COORDINATION OF CARE: 8:01 PM-Discussed treatment plan which includes **knee immobilizer and increasing pain med to norco  with pt at bedside and pt agreed to plan.  Labs Review Labs Reviewed - No data to display  Imaging Review Dg Knee Complete 4 Views Right  11/16/2013   CLINICAL DATA:  Right knee pain  EXAM: RIGHT KNEE - COMPLETE 4+ VIEW  COMPARISON:  Tibia/ fibula radiographs 02/07/2010  FINDINGS: There is no evidence of fracture, dislocation, or joint effusion. There is no evidence of arthropathy or other focal bone abnormality. Soft tissues are unremarkable. Minimal tibial spine spurring is identified. Minimal tricompartmental degenerative change.  IMPRESSION: No acute abnormality.   Electronically Signed   By: Christiana Pellant M.D.   On: 11/16/2013 19:15     EKG Interpretation None      MDM  X-ray of the right knee reveals tricompartmental degenerative disease. There is no fracture or dislocation appreciated. Patient has a significant amount of pain related to movement or palpation involving the knee. The patient was placed in the knee immobilizer, he has his own crutches. The patient has been taking hydrocodone 7.5 mg, will advance the patient to 10 mg every 4 hours. Patient is advised to continue his other current medications. He is to see Dr. Hilda Lias in the office this week for additional evaluation.    Final diagnoses:  None   I have reviewed nursing notes, vital signs, and all appropriate lab and imaging results for this patient.* **I personally performed the services described in this documentation, which was scribed in my presence. The recorded information has been reviewed and  is accurate.Kathie Dike, PA-C 11/16/13 2035

## 2013-11-16 NOTE — ED Notes (Signed)
Pt presents to the ED c/o RT knee pain. Pt states he was playing basketball when he tripped in a hole. Pt states he landed on his back. NAD noted in triage.

## 2013-11-16 NOTE — ED Notes (Signed)
Knee immobilizer applied to right knee.  Discharge instructions and prescription given and reviewed with patient.  Patient verbalized understanding of sedating effects of medication and to follow up with Dr. Hilda Lias.

## 2013-11-16 NOTE — Discharge Instructions (Signed)
The x-ray of your right knee is negative for fracture or dislocation. There is extensive arthritis present. Your examination is limited because of pain, but seems to be related to knee strain compounded by extensive arthritis. Please use the knee immobilizer and crutches until seen by your orthopedist.

## 2014-04-21 ENCOUNTER — Encounter (HOSPITAL_COMMUNITY): Payer: Self-pay

## 2014-04-21 ENCOUNTER — Emergency Department (HOSPITAL_COMMUNITY)
Admission: EM | Admit: 2014-04-21 | Discharge: 2014-04-21 | Disposition: A | Discharge status: Home / Self Care | Payer: BLUE CROSS/BLUE SHIELD | Attending: Emergency Medicine | Admitting: Emergency Medicine

## 2014-04-21 DIAGNOSIS — Z791 Long term (current) use of non-steroidal anti-inflammatories (NSAID): Secondary | ICD-10-CM | POA: Insufficient documentation

## 2014-04-21 DIAGNOSIS — Z7982 Long term (current) use of aspirin: Secondary | ICD-10-CM | POA: Insufficient documentation

## 2014-04-21 DIAGNOSIS — Z79899 Other long term (current) drug therapy: Secondary | ICD-10-CM | POA: Insufficient documentation

## 2014-04-21 DIAGNOSIS — R112 Nausea with vomiting, unspecified: Secondary | ICD-10-CM

## 2014-04-21 DIAGNOSIS — R197 Diarrhea, unspecified: Secondary | ICD-10-CM | POA: Insufficient documentation

## 2014-04-21 DIAGNOSIS — E119 Type 2 diabetes mellitus without complications: Secondary | ICD-10-CM | POA: Insufficient documentation

## 2014-04-21 DIAGNOSIS — E785 Hyperlipidemia, unspecified: Secondary | ICD-10-CM | POA: Insufficient documentation

## 2014-04-21 DIAGNOSIS — Z87891 Personal history of nicotine dependence: Secondary | ICD-10-CM | POA: Insufficient documentation

## 2014-04-21 DIAGNOSIS — I10 Essential (primary) hypertension: Secondary | ICD-10-CM | POA: Insufficient documentation

## 2014-04-21 LAB — COMPREHENSIVE METABOLIC PANEL
ALK PHOS: 69 U/L (ref 39–117)
ALT: 91 U/L — AB (ref 0–53)
ANION GAP: 7 (ref 5–15)
AST: 45 U/L — ABNORMAL HIGH (ref 0–37)
Albumin: 3.8 g/dL (ref 3.5–5.2)
BILIRUBIN TOTAL: 0.5 mg/dL (ref 0.3–1.2)
BUN: 14 mg/dL (ref 6–23)
CO2: 23 mmol/L (ref 19–32)
Calcium: 9 mg/dL (ref 8.4–10.5)
Chloride: 103 mmol/L (ref 96–112)
Creatinine, Ser: 0.68 mg/dL (ref 0.50–1.35)
GFR calc Af Amer: >90 mL/min (ref >90)
GFR calc non Af Amer: >90 mL/min (ref >90)
GLUCOSE: 157 mg/dL — AB (ref 70–99)
POTASSIUM: 4.1 mmol/L (ref 3.5–5.1)
SODIUM: 133 mmol/L — AB (ref 135–145)
Total Protein: 7.5 g/dL (ref 6.0–8.3)

## 2014-04-21 LAB — URINALYSIS, ROUTINE W REFLEX MICROSCOPIC
Bilirubin Urine: NEGATIVE
Glucose, UA: 250 mg/dL — AB
Hgb urine dipstick: NEGATIVE
Ketones, ur: NEGATIVE mg/dL
LEUKOCYTES UA: NEGATIVE
Nitrite: NEGATIVE
PH: 5.5 (ref 5.0–8.0)
Protein, ur: NEGATIVE mg/dL
Specific Gravity, Urine: >1.03 — ABNORMAL HIGH (ref 1.005–1.030)
Urobilinogen, UA: 0.2 mg/dL (ref 0.0–1.0)

## 2014-04-21 LAB — CBC WITH DIFFERENTIAL/PLATELET
Basophils Absolute: 0.1 10*3/uL (ref 0.0–0.1)
Basophils Relative: 2 % — ABNORMAL HIGH (ref 0–1)
EOS ABS: 0.2 10*3/uL (ref 0.0–0.7)
Eosinophils Relative: 3 % (ref 0–5)
HCT: 47 % (ref 39.0–52.0)
Hemoglobin: 15.8 g/dL (ref 13.0–17.0)
Lymphocytes Relative: 38 % (ref 12–46)
Lymphs Abs: 2.7 10*3/uL (ref 0.7–4.0)
MCH: 28 pg (ref 26.0–34.0)
MCHC: 33.6 g/dL (ref 30.0–36.0)
MCV: 83.2 fL (ref 78.0–100.0)
Monocytes Absolute: 0.8 10*3/uL (ref 0.1–1.0)
Monocytes Relative: 11 % (ref 3–12)
Neutro Abs: 3.3 10*3/uL (ref 1.7–7.7)
Neutrophils Relative %: 46 % (ref 43–77)
Platelets: 318 10*3/uL (ref 150–400)
RBC: 5.65 MIL/uL (ref 4.22–5.81)
RDW: 13.8 % (ref 11.5–15.5)
WBC: 7.1 10*3/uL (ref 4.0–10.5)

## 2014-04-21 MED ORDER — ONDANSETRON 4 MG PO TBDP: 4.0000 mg | ORAL_TABLET | Freq: Three times a day (TID) | ORAL | Status: DC | PRN

## 2014-04-21 MED ORDER — LOPERAMIDE HCL 2 MG PO CAPS: 2.0000 mg | ORAL_CAPSULE | Freq: Four times a day (QID) | ORAL | Status: DC | PRN

## 2014-04-21 MED ORDER — ONDANSETRON 4 MG PO TBDP
4.0000 mg | ORAL_TABLET | Freq: Once | ORAL | Status: AC
  Administered 2014-04-21: 4 mg via ORAL
  Filled 2014-04-21: qty 1

## 2014-04-21 MED ORDER — PROMETHAZINE HCL 25 MG PO TABS: 25.0000 mg | ORAL_TABLET | Freq: Four times a day (QID) | ORAL | Status: DC | PRN

## 2014-04-21 NOTE — Discharge Instructions (Signed)

## 2014-04-21 NOTE — ED Provider Notes (Signed)
TIME SEEN: 8:40 PM  CHIEF COMPLAINT: Nausea, vomiting, diarrhea  HPI: Pt is a 44 y.o. male with history of hypertension, diabetes, hyperlipidemia who presents emergency department 2 days of nausea, vomiting and diarrhea. Reports he had 2 episodes of vomiting yesterday and 2 episodes of vomiting today. Describes his stools as loose. No fevers or chills. No abdominal pain. States his wife works at a daycare with multiple children who have had similar symptoms. Denies any other sick contacts or recent travel. Has had a prior hernia repair. Denies dysuria or hematuria.  ROS: See HPI Constitutional: no fever  Eyes: no drainage  ENT: no runny nose   Cardiovascular:  no chest pain  Resp: no SOB  GI: no vomiting GU: no dysuria Integumentary: no rash  Allergy: no hives  Musculoskeletal: no leg swelling  Neurological: no slurred speech ROS otherwise negative  PAST MEDICAL HISTORY/PAST SURGICAL HISTORY:  Past Medical History  Diagnosis Date  . Hypertension   . Diabetes mellitus   . Hyperlipidemia     MEDICATIONS:  Prior to Admission medications   Medication Sig Start Date End Date Taking? Authorizing Provider  acetaminophen (TYLENOL) 500 MG tablet Take 1,000 mg by mouth as needed. For pain    Historical Provider, MD  aspirin EC 325 MG tablet Take 325 mg by mouth daily.    Historical Provider, MD  Canagliflozin-Metformin HCl (INVOKAMET) 50-500 MG TABS Take 1 tablet by mouth 2 (two) times daily. Restart after 2 days and take with food 08/04/13   Erick BlinksJehanzeb Memon, MD  fish oil-omega-3 fatty acids 1000 MG capsule Take 1 g by mouth daily.      Historical Provider, MD  HYDROcodone-acetaminophen (NORCO) 7.5-325 MG per tablet Take 1 tablet by mouth every 6 (six) hours as needed. For pain 11/05/13   Historical Provider, MD  HYDROcodone-acetaminophen (NORCO/VICODIN) 5-325 MG per tablet 1 or 2 po q4h prn pain 11/16/13   Kathie DikeHobson M Bryant, PA-C  lisinopril (PRINIVIL,ZESTRIL) 10 MG tablet Take 10 mg by mouth  daily.    Historical Provider, MD  naproxen (NAPROSYN) 500 MG tablet Take 500 mg by mouth 2 (two) times daily. 10/31/13   Historical Provider, MD  pravastatin (PRAVACHOL) 20 MG tablet Take 20 mg by mouth daily.    Historical Provider, MD    ALLERGIES:  Allergies  Allergen Reactions  . Penicillins Rash    SOCIAL HISTORY:  History  Substance Use Topics  . Smoking status: Former Smoker -- 3 years    Types: Cigarettes    Quit date: 12/10/1988  . Smokeless tobacco: Current User    Types: Snuff  . Alcohol Use: No    FAMILY HISTORY: Family History  Problem Relation Age of Onset  . Diabetes Mother   . Hypertension Mother   . Diabetes Father   . Hypertension Father   . Stroke Other     EXAM: BP 154/99 mmHg  Pulse 83  Temp(Src) 97.9 F (36.6 C) (Oral)  Resp 18  Ht 6\' 2"  (1.88 m)  Wt 308 lb (139.708 kg)  BMI 39.53 kg/m2  SpO2 99% CONSTITUTIONAL: Alert and oriented and responds appropriately to questions. Well-appearing; well-nourished HEAD: Normocephalic EYES: Conjunctivae clear, PERRL ENT: normal nose; no rhinorrhea; moist mucous membranes; pharynx without lesions noted NECK: Supple, no meningismus, no LAD  CARD: RRR; S1 and S2 appreciated; no murmurs, no clicks, no rubs, no gallops RESP: Normal chest excursion without splinting or tachypnea; breath sounds clear and equal bilaterally; no wheezes, no rhonchi, no rales ABD/GI: Normal bowel  sounds; non-distended; soft, non-tender, no rebound, no guarding; non-peritoneal BACK:  The back appears normal and is non-tender to palpation, there is no CVA tenderness EXT: Normal ROM in all joints; non-tender to palpation; no edema; normal capillary refill; no cyanosis    SKIN: Normal color for age and race; warm NEURO: Moves all extremities equally PSYCH: The patient's mood and manner are appropriate. Grooming and personal hygiene are appropriate.  MEDICAL DECISION MAKING: Patient here with likely viral gastroenteritis. Abdominal  exam benign. He is hemodynamically stable, afebrile and does not appear dehydrated. We'll give oral Zofran and by mouth challenge. Labs, urinalysis pending. I do not feel he needs abdominal imaging at this time.  ED PROGRESS: Labs and urine are unremarkable. We'll discharge him with prescriptions for Zofran and Phenergan. He has tolerated by mouth without difficulty. Reports his nausea has improved. Discussed return precautions. He verbalizes understanding is comfortable with plan. Suspect viral illness. Will discharge with work note.     Layla Maw Enez Monahan, DO 04/21/14 2144

## 2014-04-21 NOTE — ED Notes (Signed)
Patient c/o of N/V, chills since yesterday. Vomited times 2 today, last adequate PO intake Monday.

## 2014-04-28 ENCOUNTER — Emergency Department (HOSPITAL_COMMUNITY)
Admission: EM | Admit: 2014-04-28 | Discharge: 2014-04-28 | Disposition: A | Discharge status: Home / Self Care | Payer: BLUE CROSS/BLUE SHIELD | Attending: Emergency Medicine | Admitting: Emergency Medicine

## 2014-04-28 ENCOUNTER — Encounter (HOSPITAL_COMMUNITY): Payer: Self-pay

## 2014-04-28 DIAGNOSIS — Z7982 Long term (current) use of aspirin: Secondary | ICD-10-CM | POA: Insufficient documentation

## 2014-04-28 DIAGNOSIS — J019 Acute sinusitis, unspecified: Secondary | ICD-10-CM | POA: Insufficient documentation

## 2014-04-28 DIAGNOSIS — Z87891 Personal history of nicotine dependence: Secondary | ICD-10-CM | POA: Insufficient documentation

## 2014-04-28 DIAGNOSIS — R112 Nausea with vomiting, unspecified: Secondary | ICD-10-CM | POA: Insufficient documentation

## 2014-04-28 DIAGNOSIS — Z880 Allergy status to penicillin: Secondary | ICD-10-CM | POA: Insufficient documentation

## 2014-04-28 DIAGNOSIS — E119 Type 2 diabetes mellitus without complications: Secondary | ICD-10-CM | POA: Insufficient documentation

## 2014-04-28 DIAGNOSIS — I10 Essential (primary) hypertension: Secondary | ICD-10-CM | POA: Insufficient documentation

## 2014-04-28 DIAGNOSIS — Z791 Long term (current) use of non-steroidal anti-inflammatories (NSAID): Secondary | ICD-10-CM | POA: Insufficient documentation

## 2014-04-28 DIAGNOSIS — E669 Obesity, unspecified: Secondary | ICD-10-CM | POA: Insufficient documentation

## 2014-04-28 DIAGNOSIS — R197 Diarrhea, unspecified: Secondary | ICD-10-CM | POA: Insufficient documentation

## 2014-04-28 DIAGNOSIS — Z79899 Other long term (current) drug therapy: Secondary | ICD-10-CM | POA: Insufficient documentation

## 2014-04-28 LAB — COMPREHENSIVE METABOLIC PANEL
ALK PHOS: 64 U/L (ref 39–117)
ALT: 85 U/L — AB (ref 0–53)
AST: 44 U/L — AB (ref 0–37)
Albumin: 3.8 g/dL (ref 3.5–5.2)
Anion gap: 8 (ref 5–15)
BUN: 16 mg/dL (ref 6–23)
CO2: 23 mmol/L (ref 19–32)
Calcium: 8.5 mg/dL (ref 8.4–10.5)
Chloride: 102 mmol/L (ref 96–112)
Creatinine, Ser: 0.84 mg/dL (ref 0.50–1.35)
GLUCOSE: 212 mg/dL — AB (ref 70–99)
Potassium: 4.3 mmol/L (ref 3.5–5.1)
Sodium: 133 mmol/L — ABNORMAL LOW (ref 135–145)
Total Bilirubin: 0.5 mg/dL (ref 0.3–1.2)
Total Protein: 7.5 g/dL (ref 6.0–8.3)

## 2014-04-28 LAB — CBC
HCT: 47.5 % (ref 39.0–52.0)
Hemoglobin: 15.6 g/dL (ref 13.0–17.0)
MCH: 26.8 pg (ref 26.0–34.0)
MCHC: 32.8 g/dL (ref 30.0–36.0)
MCV: 81.5 fL (ref 78.0–100.0)
PLATELETS: 306 10*3/uL (ref 150–400)
RBC: 5.83 MIL/uL — AB (ref 4.22–5.81)
RDW: 14 % (ref 11.5–15.5)
WBC: 7 10*3/uL (ref 4.0–10.5)

## 2014-04-28 LAB — URINALYSIS, ROUTINE W REFLEX MICROSCOPIC
BILIRUBIN URINE: NEGATIVE
Glucose, UA: NEGATIVE mg/dL
HGB URINE DIPSTICK: NEGATIVE
KETONES UR: NEGATIVE mg/dL
Leukocytes, UA: NEGATIVE
Nitrite: NEGATIVE
PH: 5.5 (ref 5.0–8.0)
Protein, ur: NEGATIVE mg/dL
SPECIFIC GRAVITY, URINE: 1.015 (ref 1.005–1.030)
Urobilinogen, UA: 0.2 mg/dL (ref 0.0–1.0)

## 2014-04-28 LAB — HEPATITIS PANEL, ACUTE
HCV AB: NEGATIVE
HEP A IGM: NONREACTIVE
Hep B C IgM: NONREACTIVE
Hepatitis B Surface Ag: NEGATIVE

## 2014-04-28 LAB — LIPASE, BLOOD: Lipase: 25 U/L (ref 11–59)

## 2014-04-28 MED ORDER — SODIUM CHLORIDE 0.9 % IV BOLUS (SEPSIS)
1000.0000 mL | Freq: Once | INTRAVENOUS | Status: AC
  Administered 2014-04-28: 1000 mL via INTRAVENOUS

## 2014-04-28 MED ORDER — ONDANSETRON HCL 4 MG/2ML IJ SOLN
4.0000 mg | Freq: Once | INTRAMUSCULAR | Status: AC
  Administered 2014-04-28: 4 mg via INTRAVENOUS
  Filled 2014-04-28: qty 2

## 2014-04-28 MED ORDER — AZITHROMYCIN 250 MG PO TABS: 250.0000 mg | ORAL_TABLET | Freq: Every day | ORAL | Status: DC

## 2014-04-28 MED ORDER — METOCLOPRAMIDE HCL 10 MG PO TABS: 10.0000 mg | ORAL_TABLET | Freq: Three times a day (TID) | ORAL | Status: DC | PRN

## 2014-04-28 MED ORDER — METOCLOPRAMIDE HCL 5 MG/ML IJ SOLN
10.0000 mg | Freq: Once | INTRAMUSCULAR | Status: AC
  Administered 2014-04-28: 10 mg via INTRAVENOUS
  Filled 2014-04-28: qty 2

## 2014-04-28 MED ORDER — ONDANSETRON 4 MG PO TBDP: 4.0000 mg | ORAL_TABLET | Freq: Three times a day (TID) | ORAL | Status: DC | PRN

## 2014-04-28 NOTE — ED Provider Notes (Signed)
CSN: [id number]     Arrival date & time 04/28/14  0320 History   First MD Initiated Contact with Patient 04/28/14 [phone]     Chief Complaint  Patient presents with  . Emesis  . Diarrhea     (Consider location/radiation/quality/duration/timing/severity/associated sxs/prior Treatment) HPI Comments: 44 y/o male with hx of DM - Htn and hyperlipidemia - presents with intermittent n/v/d - had similar sx last week when he was seen in ED for same and was Rx meds for nausea - he had improved for several days and then yesterday started to have nausea again and 18 hours ago awoke with 2 episodes of watery yellow and brown diarrhea.  He then had a soft stool later in the day.  He does report using naprosyn twice daily for chronic knee pain and that he has had sinus drainage down his throat which he has been coughing up for the last 2 weeks.  He denies blood in his emesis or stool.  He now has ongoing nausea which is not going away - he has not been taking his Blood Sugar but states that it has been < 200 the last few times he has checked it.  He has prior abdominal surgery X 2 on hernias, no other abd surgery and the last 2 days has had only minimal epigastric cramping which is also intermittent.  No fevers, chills, headache, sore throat, visual changes, neck pain, back pain, chest pain, shortness of breath, dysuria, rectal bleeding, swelling, rashes, numbness or weakness.   Patient is a 44 y.o. male presenting with vomiting and diarrhea. The history is provided by the patient and medical records.  Emesis Associated symptoms: diarrhea   Diarrhea Associated symptoms: vomiting     Past Medical History  Diagnosis Date  . Hypertension   . Diabetes mellitus   . Hyperlipidemia    Past Surgical History  Procedure Laterality Date  . Hernia repair     Family History  Problem Relation Age of Onset  . Diabetes Mother   . Hypertension Mother   . Diabetes Father   . Hypertension Father   . Stroke Other     History  Substance Use Topics  . Smoking status: Former Smoker -- 3 years    Types: Cigarettes    Quit date: 12/10/1988  . Smokeless tobacco: Current User    Types: Snuff  . Alcohol Use: No    Review of Systems  Gastrointestinal: Positive for vomiting and diarrhea.  All other systems reviewed and are negative.     Allergies  Penicillins  Home Medications   Prior to Admission medications   Medication Sig Start Date End Date Taking? Authorizing Provider  aspirin EC 325 MG tablet Take 325 mg by mouth daily.   Yes Historical Provider, MD  Canagliflozin-Metformin HCl (INVOKAMET) 50-500 MG TABS Take 1 tablet by mouth 2 (two) times daily. Restart after 2 days and take with food Patient taking differently: Take 1 tablet by mouth 2 (two) times daily.  08/04/13  Yes Erick Blinks, MD  lisinopril (PRINIVIL,ZESTRIL) 10 MG tablet Take 10 mg by mouth daily.   Yes Historical Provider, MD  loperamide (IMODIUM) 2 MG capsule Take 1 capsule (2 mg total) by mouth 4 (four) times daily as needed for diarrhea or loose stools. 04/21/14  Yes Kristen N Ward, DO  naproxen (NAPROSYN) 500 MG tablet Take 500 mg by mouth 2 (two) times daily. 10/31/13  Yes Historical Provider, MD  pravastatin (PRAVACHOL) 20 MG tablet Take 20 mg by  mouth daily.   Yes Historical Provider, MD  promethazine (PHENERGAN) 25 MG tablet Take 1 tablet (25 mg total) by mouth every 6 (six) hours as needed for nausea or vomiting. 04/21/14  Yes Kristen N Ward, DO  acetaminophen (TYLENOL) 500 MG tablet Take 1,000 mg by mouth as needed. For pain    Historical Provider, MD  azithromycin (ZITHROMAX Z-PAK) 250 MG tablet Take 1 tablet (250 mg total) by mouth daily.  PO day 1, then  PO days 205 04/28/14   Vida Roller, MD  HYDROcodone-acetaminophen (NORCO/VICODIN) 5-325 MG per tablet 1 or 2 po q4h prn pain 11/16/13   Kathie Dike, PA-C  metoCLOPramide (REGLAN) 10 MG tablet Take 1 tablet (10 mg total) by mouth 3 (three) times daily as  needed for nausea (headache / nausea). 04/28/14   Vida Roller, MD  ondansetron (ZOFRAN ODT) 4 MG disintegrating tablet Take 1 tablet (4 mg total) by mouth every 8 (eight) hours as needed for nausea. 04/28/14   Vida Roller, MD  promethazine (PHENERGAN) 25 MG tablet Take 1 tablet (25 mg total) by mouth every 6 (six) hours as needed for nausea. 12/11/10 12/18/10  Kathie Dike, PA-C   BP 152/88 mmHg  Pulse 75  Temp(Src) 98.2 F (36.8 C) (Oral)  Resp 20  SpO2 100% Physical Exam  Constitutional: He appears well-developed and well-nourished. No distress.  HENT:  Head: Normocephalic and atraumatic.  Mouth/Throat: Oropharynx is clear and moist. No oropharyngeal exudate.  Eyes: Conjunctivae and EOM are normal. Pupils are equal, round, and reactive to light. Right eye exhibits no discharge. Left eye exhibits no discharge. No scleral icterus.  Neck: Normal range of motion. Neck supple. No JVD present. No thyromegaly present.  Cardiovascular: Normal rate, regular rhythm, normal heart sounds and intact distal pulses.  Exam reveals no gallop and no friction rub.   No murmur heard. Pulmonary/Chest: Effort normal and breath sounds normal. No respiratory distress. He has no wheezes. He has no rales.  Abdominal: Soft. Bowel sounds are normal. He exhibits no distension and no mass. There is tenderness.  Minimal abd ttp in the epigastrium, obese and no other ttp.  Musculoskeletal: Normal range of motion. He exhibits no edema or tenderness.  Lymphadenopathy:    He has no cervical adenopathy.  Neurological: He is alert. Coordination normal.  Skin: Skin is warm and dry. No rash noted. No erythema.  Psychiatric: He has a normal mood and affect. His behavior is normal.  Nursing note and vitals reviewed.   ED Course  Procedures (including critical care time) Labs Review Labs Reviewed  COMPREHENSIVE METABOLIC PANEL - Abnormal; Notable for the following:    Sodium 133 (*)    Glucose, Bld 212 (*)    AST  44 (*)    ALT 85 (*)    All other components within normal limits  CBC - Abnormal; Notable for the following:    RBC 5.83 (*)    All other components within normal limits  LIPASE, BLOOD  URINALYSIS, ROUTINE W REFLEX MICROSCOPIC  HEPATITIS PANEL, ACUTE    Imaging Review No results found.    MDM   Final diagnoses:  Nausea vomiting and diarrhea  Acute sinusitis, recurrence not specified, unspecified location    The pt is benign in appearance - he has normal VS without any concerns.  Possible ongoing infection in the GI tract though less likely - consider PUD as source of pt's nausea, as well as sinusitis as source of nausea, gastroparesis  as well on the differential.  Fluids, meds, labs  Mild transaminitis is only abnormality - with GI sx, will send hepatitis panel - VS remain normal, meds given with improvement in nausea.  Meds given in ED:  Medications  sodium chloride 0.9 % bolus 1,000 mL (0 mLs Intravenous Stopped 04/28/14 0520)  ondansetron (ZOFRAN) injection 4 mg (4 mg Intravenous Given 04/28/14 0403)  metoCLOPramide (REGLAN) injection 10 mg (10 mg Intravenous Given 04/28/14 0403)    New Prescriptions   AZITHROMYCIN (ZITHROMAX Z-PAK) 250 MG TABLET    Take 1 tablet (250 mg total) by mouth daily. 500mg  PO day 1, then 250mg  PO days 205   METOCLOPRAMIDE (REGLAN) 10 MG TABLET    Take 1 tablet (10 mg total) by mouth 3 (three) times daily as needed for nausea (headache / nausea).   ONDANSETRON (ZOFRAN ODT) 4 MG DISINTEGRATING TABLET    Take 1 tablet (4 mg total) by mouth every 8 (eight) hours as needed for nausea.      Vida RollerBrian D Cynthie Garmon, MD 04/28/14 [phone]

## 2014-04-28 NOTE — ED Notes (Signed)
Pt seen here last week for same symptoms of vomiting and diarrhea.  Pt states he thought he was feeling better, but is still vomiting and having diarrhea.

## 2014-04-28 NOTE — ED Notes (Signed)
Pt sleeping at present time.  Respirations regular, even and unlabored.

## 2014-04-28 NOTE — Discharge Instructions (Signed)
Please call your doctor for a followup appointment within 24-48 hours. When you talk to your doctor please let them know that you were seen in the emergency department and have them acquire all of your records so that they can discuss the findings with you and formulate a treatment plan to fully care for your new and ongoing problems.  The naproxyn may be contributing to the nausea causing stomach inrritation, you may try taking pepcid or zantac before bed each night.    .drink pleny of fluids.

## 2014-07-08 ENCOUNTER — Encounter (HOSPITAL_COMMUNITY): Payer: Self-pay

## 2014-07-08 ENCOUNTER — Emergency Department (HOSPITAL_COMMUNITY)
Admission: EM | Admit: 2014-07-08 | Discharge: 2014-07-08 | Disposition: A | Discharge status: Home / Self Care | Payer: Self-pay | Attending: Emergency Medicine | Admitting: Emergency Medicine

## 2014-07-08 DIAGNOSIS — B9689 Other specified bacterial agents as the cause of diseases classified elsewhere: Secondary | ICD-10-CM

## 2014-07-08 DIAGNOSIS — J029 Acute pharyngitis, unspecified: Secondary | ICD-10-CM | POA: Insufficient documentation

## 2014-07-08 DIAGNOSIS — E785 Hyperlipidemia, unspecified: Secondary | ICD-10-CM | POA: Insufficient documentation

## 2014-07-08 DIAGNOSIS — Z87891 Personal history of nicotine dependence: Secondary | ICD-10-CM | POA: Insufficient documentation

## 2014-07-08 DIAGNOSIS — Z7982 Long term (current) use of aspirin: Secondary | ICD-10-CM | POA: Insufficient documentation

## 2014-07-08 DIAGNOSIS — Z880 Allergy status to penicillin: Secondary | ICD-10-CM | POA: Insufficient documentation

## 2014-07-08 DIAGNOSIS — I10 Essential (primary) hypertension: Secondary | ICD-10-CM | POA: Insufficient documentation

## 2014-07-08 DIAGNOSIS — E119 Type 2 diabetes mellitus without complications: Secondary | ICD-10-CM | POA: Insufficient documentation

## 2014-07-08 DIAGNOSIS — Z79899 Other long term (current) drug therapy: Secondary | ICD-10-CM | POA: Insufficient documentation

## 2014-07-08 DIAGNOSIS — J028 Acute pharyngitis due to other specified organisms: Secondary | ICD-10-CM

## 2014-07-08 DIAGNOSIS — A499 Bacterial infection, unspecified: Secondary | ICD-10-CM | POA: Insufficient documentation

## 2014-07-08 MED ORDER — AZITHROMYCIN 250 MG PO TABS: ORAL_TABLET | ORAL | Status: DC

## 2014-07-08 MED ORDER — AZITHROMYCIN 250 MG PO TABS
500.0000 mg | ORAL_TABLET | Freq: Once | ORAL | Status: AC
  Administered 2014-07-08: 500 mg via ORAL
  Filled 2014-07-08: qty 2

## 2014-07-08 NOTE — ED Provider Notes (Signed)
CSN: [id number]     Arrival date & time 07/08/14  1903 History   First MD Initiated Contact with Patient 07/08/14 1942     Chief Complaint  Patient presents with  . Sore Throat     (Consider location/radiation/quality/duration/timing/severity/associated sxs/prior Treatment) Patient is a 44 y.o. male presenting with pharyngitis. The history is provided by the patient.  Sore Throat This is a new problem. The current episode started in the past 7 days. The problem occurs constantly. The problem has been gradually worsening. Associated symptoms include chills, a fever, a sore throat and swollen glands.   Jose Short. is a a.ge male who presents to the ED with sore throat that started 2 days ago and sinus pain. His wife was here 5 days ago and tested positive for strep and his son was here a few days before that and tested positive for strep. Patient has had fever and chills.  Past Medical History  Diagnosis Date  . Hypertension   . Diabetes mellitus   . Hyperlipidemia    Past Surgical History  Procedure Laterality Date  . Hernia repair     Family History  Problem Relation Age of Onset  . Diabetes Mother   . Hypertension Mother   . Diabetes Father   . Hypertension Father   . Stroke Other    History  Substance Use Topics  . Smoking status: Former Smoker -- 3 years    Types: Cigarettes    Quit date: 12/10/1988  . Smokeless tobacco: Current User    Types: Snuff  . Alcohol Use: No    Review of Systems  Constitutional: Positive for fever and chills.  HENT: Positive for sinus pressure and sore throat.   all other systems negative    Allergies  Penicillins  Home Medications   Prior to Admission medications   Medication Sig Start Date End Date Taking? Authorizing Provider  acetaminophen (TYLENOL) 500 MG tablet Take 1,000 mg by mouth as needed. For pain    Historical Provider, MD  aspirin EC 325 MG tablet Take 325 mg by mouth daily.    Historical Provider, MD   azithromycin (ZITHROMAX) 250 MG tablet Take one tablet PO daily starting 07/09/14. 07/08/14   Tamitha Norell Orlene Och, NP  Canagliflozin-Metformin HCl (INVOKAMET) 50-500 MG TABS Take 1 tablet by mouth 2 (two) times daily. Restart after 2 days and take with food Patient taking differently: Take 1 tablet by mouth 2 (two) times daily.  08/04/13   Erick Blinks, MD  HYDROcodone-acetaminophen (NORCO/VICODIN) 5-325 MG per tablet 1 or 2 po q4h prn pain 11/16/13   Ivery Quale, PA-C  lisinopril (PRINIVIL,ZESTRIL) 10 MG tablet Take 10 mg by mouth daily.    Historical Provider, MD  loperamide (IMODIUM) 2 MG capsule Take 1 capsule (2 mg total) by mouth 4 (four) times daily as needed for diarrhea or loose stools. 04/21/14   Kristen N Ward, DO  metoCLOPramide (REGLAN) 10 MG tablet Take 1 tablet (10 mg total) by mouth 3 (three) times daily as needed for nausea (headache / nausea). 04/28/14   Eber Hong, MD  naproxen (NAPROSYN) 500 MG tablet Take 500 mg by mouth 2 (two) times daily. 10/31/13   Historical Provider, MD  ondansetron (ZOFRAN ODT) 4 MG disintegrating tablet Take 1 tablet (4 mg total) by mouth every 8 (eight) hours as needed for nausea. 04/28/14   Eber Hong, MD  pravastatin (PRAVACHOL) 20 MG tablet Take 20 mg by mouth daily.    Historical Provider, MD  promethazine (PHENERGAN) 25 MG tablet Take 1 tablet (25 mg total) by mouth every 6 (six) hours as needed for nausea. 12/11/10 12/18/10  Ivery QualeHobson Bryant, PA-C  promethazine (PHENERGAN) 25 MG tablet Take 1 tablet (25 mg total) by mouth every 6 (six) hours as needed for nausea or vomiting. 04/21/14   Kristen N Ward, DO   BP 140/96 mmHg  Pulse 98  Temp(Src) 98.7 F (37.1 C) (Oral)  Resp 16  Ht 6\' 2"  (1.88 m)  Wt 305 lb (138.347 kg)  BMI 39.14 kg/m2  SpO2 98% Physical Exam  Constitutional: He is oriented to person, place, and time. He appears well-developed and well-nourished.  HENT:  Head: Normocephalic.  Right Ear: Tympanic membrane normal.  Left Ear: Tympanic  membrane normal.  Nose: Nose normal.  Mouth/Throat: Uvula is midline and mucous membranes are normal. Oropharyngeal exudate and posterior oropharyngeal erythema present.  Poster pharynx beefy red.   Eyes: EOM are normal.  Neck: Neck supple.  Pulmonary/Chest: Effort normal.  Abdominal: Soft. There is no tenderness.  Musculoskeletal: Normal range of motion.  Neurological: He is alert and oriented to person, place, and time. No cranial nerve deficit.  Skin: Skin is warm and dry.  Psychiatric: He has a normal mood and affect. His behavior is normal.  Nursing note and vitals reviewed.   ED Course  Procedures (including critical care time) Labs Review  MDM  44 y.o. male with sore throat and fever after exposure to wife and child who both tested positive for strep. Will treat with zithromax since patient is penicillin allergic. Discussed with the patient and all questioned fully answered. He will return if any problems arise. Stable for d/c without difficulty swallowing.  Final diagnoses:  Bacterial pharyngitis      Janne NapoleonHope M Lamarr Feenstra, NP 07/08/14 2000  Azalia BilisKevin Campos, MD 07/08/14 2011

## 2014-07-08 NOTE — ED Notes (Signed)
Sore throat with fever for a couple of days. May also have a sinus infection.

## 2014-10-31 ENCOUNTER — Emergency Department (HOSPITAL_COMMUNITY): Payer: Self-pay

## 2014-10-31 ENCOUNTER — Encounter (HOSPITAL_COMMUNITY): Payer: Self-pay | Admitting: Emergency Medicine

## 2014-10-31 ENCOUNTER — Emergency Department (HOSPITAL_COMMUNITY)
Admission: EM | Admit: 2014-10-31 | Discharge: 2014-10-31 | Disposition: A | Discharge status: Home / Self Care | Payer: Self-pay | Attending: Emergency Medicine | Admitting: Emergency Medicine

## 2014-10-31 DIAGNOSIS — M545 Low back pain, unspecified: Secondary | ICD-10-CM

## 2014-10-31 DIAGNOSIS — Z7982 Long term (current) use of aspirin: Secondary | ICD-10-CM | POA: Insufficient documentation

## 2014-10-31 DIAGNOSIS — I10 Essential (primary) hypertension: Secondary | ICD-10-CM | POA: Insufficient documentation

## 2014-10-31 DIAGNOSIS — Z880 Allergy status to penicillin: Secondary | ICD-10-CM | POA: Insufficient documentation

## 2014-10-31 DIAGNOSIS — Z87891 Personal history of nicotine dependence: Secondary | ICD-10-CM | POA: Insufficient documentation

## 2014-10-31 DIAGNOSIS — E119 Type 2 diabetes mellitus without complications: Secondary | ICD-10-CM | POA: Insufficient documentation

## 2014-10-31 DIAGNOSIS — E785 Hyperlipidemia, unspecified: Secondary | ICD-10-CM | POA: Insufficient documentation

## 2014-10-31 DIAGNOSIS — Z79899 Other long term (current) drug therapy: Secondary | ICD-10-CM | POA: Insufficient documentation

## 2014-10-31 MED ORDER — CYCLOBENZAPRINE HCL 10 MG PO TABS
10.0000 mg | ORAL_TABLET | Freq: Once | ORAL | Status: AC
  Administered 2014-10-31: 10 mg via ORAL
  Filled 2014-10-31: qty 1

## 2014-10-31 MED ORDER — CYCLOBENZAPRINE HCL 5 MG PO TABS: 5.0000 mg | ORAL_TABLET | Freq: Three times a day (TID) | ORAL | Status: DC | PRN

## 2014-10-31 MED ORDER — OXYCODONE-ACETAMINOPHEN 5-325 MG PO TABS
1.0000 | ORAL_TABLET | Freq: Once | ORAL | Status: AC
  Administered 2014-10-31: 1 via ORAL
  Filled 2014-10-31: qty 1

## 2014-10-31 MED ORDER — HYDROCODONE-ACETAMINOPHEN 5-325 MG PO TABS
1.0000 | ORAL_TABLET | Freq: Once | ORAL | Status: AC
  Administered 2014-10-31: 1 via ORAL
  Filled 2014-10-31: qty 1

## 2014-10-31 MED ORDER — OXYCODONE-ACETAMINOPHEN 5-325 MG PO TABS: 1.0000 | ORAL_TABLET | ORAL | Status: DC | PRN

## 2014-10-31 NOTE — ED Notes (Signed)
Pt states that he got woke up this am with lower back pain - denies injury - Has tried OTC meds and warm shower but these have not helped - More central lower back ("dead Center")

## 2014-10-31 NOTE — Discharge Instructions (Signed)
Back Pain, Adult °Low back pain is very common. About 1 in 5 people have back pain. The cause of low back pain is rarely dangerous. The pain often gets better over time. About half of people with a sudden onset of back pain feel better in just 2 weeks. About 8 in 10 people feel better by 6 weeks.  °CAUSES °Some common causes of back pain include: °· Strain of the muscles or ligaments supporting the spine. °· Wear and tear (degeneration) of the spinal discs. °· Arthritis. °· Direct injury to the back. °DIAGNOSIS °Most of the time, the direct cause of low back pain is not known. However, back pain can be treated effectively even when the exact cause of the pain is unknown. Answering your caregiver's questions about your overall health and symptoms is one of the most accurate ways to make sure the cause of your pain is not dangerous. If your caregiver needs more information, he or she may order lab work or imaging tests (X-rays or MRIs). However, even if imaging tests show changes in your back, this usually does not require surgery. °HOME CARE INSTRUCTIONS °For many people, back pain returns. Since low back pain is rarely dangerous, it is often a condition that people can learn to manage on their own.  °· Remain active. It is stressful on the back to sit or stand in one place. Do not sit, drive, or stand in one place for more than 30 minutes at a time. Take short walks on level surfaces as soon as pain allows. Try to increase the length of time you walk each day. °· Do not stay in bed. Resting more than 1 or 2 days can delay your recovery. °· Do not avoid exercise or work. Your body is made to move. It is not dangerous to be active, even though your back may hurt. Your back will likely heal faster if you return to being active before your pain is gone. °· Pay attention to your body when you  bend and lift. Many people have less discomfort when lifting if they bend their knees, keep the load close to their bodies, and  avoid twisting. Often, the most comfortable positions are those that put less stress on your recovering back. °· Find a comfortable position to sleep. Use a firm mattress and lie on your side with your knees slightly bent. If you lie on your back, put a pillow under your knees. °· Only take over-the-counter or prescription medicines as directed by your caregiver. Over-the-counter medicines to reduce pain and inflammation are often the most helpful. Your caregiver may prescribe muscle relaxant drugs. These medicines help dull your pain so you can more quickly return to your normal activities and healthy exercise. °· Put ice on the injured area. °¨ Put ice in a plastic bag. °¨ Place a towel between your skin and the bag. °¨ Leave the ice on for 15-20 minutes, 03-04 times a day for the first 2 to 3 days. After that, ice and heat may be alternated to reduce pain and spasms. °· Ask your caregiver about trying back exercises and gentle massage. This may be of some benefit. °· Avoid feeling anxious or stressed. Stress increases muscle tension and can worsen back pain. It is important to recognize when you are anxious or stressed and learn ways to manage it. Exercise is a great option. °SEEK MEDICAL CARE IF: °· You have pain that is not relieved with rest or medicine. °· You have pain that does not improve in 1 week. °· You have new symptoms. °· You are generally not feeling well. °SEEK   IMMEDIATE MEDICAL CARE IF:   You have pain that radiates from your back into your legs.  You develop new bowel or bladder control problems.  You have unusual weakness or numbness in your arms or legs.  You develop nausea or vomiting.  You develop abdominal pain.  You feel faint. Document Released: 02/26/2005 Document Revised: 08/28/2011 Document Reviewed: 06/30/2013 [facility] Patient Information 2015 [place], [place]. This information is not intended to replace advice given to you by your health care provider. Make sure you  discuss any questions you have with your health care provider.    Do not drive within 4 hours of taking oxycodone as this will make you drowsy.  Avoid lifting,  Bending,  Twisting or any other activity that worsens your pain over the next week.  Apply an  icepack  to your lower back for 10-15 minutes every 2 hours for the next 2 days.  You should get rechecked if your symptoms are not better over the next 5 days,  Or you develop increased pain,  Weakness in your leg(s) or loss of bladder or bowel function as these can be symptoms of a worsening problem.

## 2014-10-31 NOTE — ED Provider Notes (Signed)
CSN: [id number]     Arrival date & time 10/31/14  1843 History  This chart was scribed for non-physician practitioner Burgess Amor, PA-C working with Vanetta Mulders, MD by Lyndel Safe, ED Scribe. This patient was seen in room APFT24/APFT24 and the patient's care was started at 6:56 PM.   Chief Complaint  Patient presents with  . Back Pain   The history is provided by the patient. No language interpreter was used.   HPI Comments: Jose Muto. is a 44 y.o. male, with a PMhx of HTN, HLD and DM, who presents to the Emergency Department complaining of sudden onset, waxing and waning, midline lumbar back pain onset approximately 14 hours ago. He describes the pain to be a sharp, achy, burning pain. Pt reports he was woken up with the pain early this morning. He then took a warm shower and the pain was alleviated briefly. He states after working his shift today he experienced a worsening of the pain. 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 He has taken ibuprofen and tylenol with no relief; last dose was 1 hour ago. Ambulation exacerbates his back pain. The pt has experienced back pain in the past when he was diagnosed with a lumbar sprain but denies his current pain to be similar to his past back pain. He denies any injury attributable to the pain, radiation of the pain, numbness or weakness in BLE, dysuria, hematuria, abnormal frequency or urgency, or pain before going to bed last night.   Past Medical History  Diagnosis Date  . Hypertension   . Diabetes mellitus   . Hyperlipidemia    Past Surgical History  Procedure Laterality Date  . Hernia repair     Family History  Problem Relation Age of Onset  . Diabetes Mother   . Hypertension Mother   . Diabetes Father   . Hypertension Father   . Stroke Other    Social History  Substance Use Topics  . Smoking status: Former Smoker -- 3 years    Types: Cigarettes    Quit date: 12/10/1988  . Smokeless tobacco: Current User     Types: Snuff  . Alcohol Use: No    Review of Systems  Constitutional: Negative for fever.  Respiratory: Negative for shortness of breath.   Cardiovascular: Negative for chest pain and leg swelling.  Gastrointestinal: Negative for abdominal pain, constipation and abdominal distention.  Genitourinary: Negative for dysuria, urgency, frequency, flank pain and difficulty urinating.  Musculoskeletal: Positive for back pain. Negative for joint swelling and gait problem.  Skin: Negative for rash.  Neurological: Negative for weakness and numbness.   Allergies  Penicillins  Home Medications   Prior to Admission medications   Medication Sig Start Date End Date Taking? Authorizing Provider  acetaminophen (TYLENOL) 500 MG tablet Take 1,000 mg by mouth as needed. For pain    Historical Provider, MD  aspirin EC 325 MG tablet Take 325 mg by mouth daily.    Historical Provider, MD  azithromycin (ZITHROMAX) 250 MG tablet Take one tablet PO daily starting 07/09/14. 07/08/14   Hope Orlene Och, NP  Canagliflozin-Metformin HCl (INVOKAMET) 50-500 MG TABS Take 1 tablet by mouth 2 (two) times daily. Restart after 2 days and take with food Patient taking differently: Take 1 tablet by mouth 2 (two) times daily.  08/04/13   Erick Blinks, MD  cyclobenzaprine (FLEXERIL) 5 MG tablet Take 1 tablet (5 mg total) by mouth 3 (three) times daily as needed for muscle  spasms. 10/31/14   Burgess Amor, PA-C  lisinopril (PRINIVIL,ZESTRIL) 10 MG tablet Take 10 mg by mouth daily.    Historical Provider, MD  loperamide (IMODIUM) 2 MG capsule Take 1 capsule (2 mg total) by mouth 4 (four) times daily as needed for diarrhea or loose stools. 04/21/14   Kristen N Ward, DO  metoCLOPramide (REGLAN) 10 MG tablet Take 1 tablet (10 mg total) by mouth 3 (three) times daily as needed for nausea (headache / nausea). 04/28/14   Eber Hong, MD  naproxen (NAPROSYN) 500 MG tablet Take 500 mg by mouth 2 (two) times daily. 10/31/13   Historical  Provider, MD  ondansetron (ZOFRAN ODT) 4 MG disintegrating tablet Take 1 tablet (4 mg total) by mouth every 8 (eight) hours as needed for nausea. 04/28/14   Eber Hong, MD  oxyCODONE-acetaminophen (PERCOCET/ROXICET) 5-325 MG per tablet Take 1 tablet by mouth every 4 (four) hours as needed. 10/31/14   Burgess Amor, PA-C  pravastatin (PRAVACHOL) 20 MG tablet Take 20 mg by mouth daily.    Historical Provider, MD  promethazine (PHENERGAN) 25 MG tablet Take 1 tablet (25 mg total) by mouth every 6 (six) hours as needed for nausea. 12/11/10 12/18/10  Ivery Quale, PA-C  promethazine (PHENERGAN) 25 MG tablet Take 1 tablet (25 mg total) by mouth every 6 (six) hours as needed for nausea or vomiting. 04/21/14   Kristen N Ward, DO   BP 147/87 mmHg  Pulse 71  Temp(Src) 97.6 F (36.4 C) (Oral)  Resp 16  Ht  (1.88 m)  Wt 269 lb (122.018 kg)  BMI 34.52 kg/m2  SpO2 97% Physical Exam  Constitutional: He appears well-developed and well-nourished.  HENT:  Head: Normocephalic.  Eyes: Conjunctivae are normal.  Neck: Normal range of motion. Neck supple.  Cardiovascular: Normal rate and intact distal pulses.   Pedal pulses normal.  Pulmonary/Chest: Effort normal.  Abdominal: Soft. Bowel sounds are normal. He exhibits no distension and no mass. There is no CVA tenderness.  Musculoskeletal: Normal range of motion. He exhibits tenderness. He exhibits no edema.       Lumbar back: He exhibits tenderness. He exhibits no swelling, no edema and no spasm.  Midline lumbar tenderness without palpable deformity or step-offs.   Neurological: He is alert. He has normal strength. He displays no atrophy and no tremor. No sensory deficit. Gait normal.  Reflex Scores:      Patellar reflexes are 2+ on the right side and 2+ on the left side.      Achilles reflexes are 2+ on the right side and 2+ on the left side. No strength deficit noted in hip and knee flexor and extensor muscle groups.  Ankle flexion and extension intact.   Skin: Skin is warm and dry.  Psychiatric: He has a normal mood and affect.  Nursing note and vitals reviewed.   ED Course  Procedures  DIAGNOSTIC STUDIES: Oxygen Saturation is 97% on RA, normal by my interpretation.    COORDINATION OF CARE: 7:06 PM Discussed treatment plan which includes to order Xray of lumbar spine and pain medication with pt. Pt acknowledges and agrees to plan.   Labs Review Labs Reviewed - No data to display  Imaging Review Dg Lumbar Spine Complete  10/31/2014   CLINICAL DATA:  Mid and low back pain for 14 hours. No known injury. Initial encounter.  EXAM: LUMBAR SPINE - COMPLETE 4+ VIEW  COMPARISON:  CT abdomen and pelvis 08/03/2013.  FINDINGS: There is no evidence of lumbar spine  fracture. Alignment is normal. Intervertebral disc spaces are maintained. Mesh from prior hernia repair is noted.  IMPRESSION: Normal-appearing lumbar spine.   Electronically Signed   By: Drusilla Kanner M.D.   On: 10/31/2014 19:53   I have personally reviewed and evaluated these images and lab results as part of my medical decision-making.  MDM   Final diagnoses:  Midline low back pain without sciatica    No neuro deficit on exam or by history to suggest emergent or surgical presentation.  Also discussed worsened sx that should prompt immediate re-evaluation including distal weakness, bowel/bladder retention/incontinence.   Pt was prescribed oxycodone as the hydrocodone given here gave only partial relief.  Ice, activity as tolerated.  Flexeril, naproxen.  F/u with pcp if not improved over the  Next week.     I personally performed the services described in this documentation, which was scribed in my presence. The recorded information has been reviewed and is accurate.   Burgess Amor, PA-C 10/31/14 2014  Vanetta Mulders, MD 10/31/14 [phone]

## 2014-12-09 ENCOUNTER — Emergency Department (HOSPITAL_COMMUNITY)
Admission: EM | Admit: 2014-12-09 | Discharge: 2014-12-10 | Disposition: A | Discharge status: Home / Self Care | Payer: Self-pay | Attending: Emergency Medicine | Admitting: Emergency Medicine

## 2014-12-09 ENCOUNTER — Encounter (HOSPITAL_COMMUNITY): Payer: Self-pay | Admitting: *Deleted

## 2014-12-09 DIAGNOSIS — E1165 Type 2 diabetes mellitus with hyperglycemia: Secondary | ICD-10-CM | POA: Insufficient documentation

## 2014-12-09 DIAGNOSIS — I10 Essential (primary) hypertension: Secondary | ICD-10-CM | POA: Insufficient documentation

## 2014-12-09 DIAGNOSIS — Z79899 Other long term (current) drug therapy: Secondary | ICD-10-CM | POA: Insufficient documentation

## 2014-12-09 DIAGNOSIS — Z87891 Personal history of nicotine dependence: Secondary | ICD-10-CM | POA: Insufficient documentation

## 2014-12-09 DIAGNOSIS — Z880 Allergy status to penicillin: Secondary | ICD-10-CM | POA: Insufficient documentation

## 2014-12-09 DIAGNOSIS — Z7982 Long term (current) use of aspirin: Secondary | ICD-10-CM | POA: Insufficient documentation

## 2014-12-09 DIAGNOSIS — R51 Headache: Secondary | ICD-10-CM | POA: Insufficient documentation

## 2014-12-09 DIAGNOSIS — R110 Nausea: Secondary | ICD-10-CM | POA: Insufficient documentation

## 2014-12-09 DIAGNOSIS — R739 Hyperglycemia, unspecified: Secondary | ICD-10-CM

## 2014-12-09 DIAGNOSIS — R519 Headache, unspecified: Secondary | ICD-10-CM

## 2014-12-09 DIAGNOSIS — H53149 Visual discomfort, unspecified: Secondary | ICD-10-CM | POA: Insufficient documentation

## 2014-12-09 LAB — CBC WITH DIFFERENTIAL/PLATELET
BASOS PCT: 1 %
Basophils Absolute: 0.1 10*3/uL (ref 0.0–0.1)
EOS ABS: 0.1 10*3/uL (ref 0.0–0.7)
EOS PCT: 3 %
HCT: 44.3 % (ref 39.0–52.0)
Hemoglobin: 15.2 g/dL (ref 13.0–17.0)
LYMPHS ABS: 2.2 10*3/uL (ref 0.7–4.0)
Lymphocytes Relative: 41 %
MCH: 28.1 pg (ref 26.0–34.0)
MCHC: 34.3 g/dL (ref 30.0–36.0)
MCV: 81.9 fL (ref 78.0–100.0)
MONOS PCT: 9 %
Monocytes Absolute: 0.5 10*3/uL (ref 0.1–1.0)
Neutro Abs: 2.4 10*3/uL (ref 1.7–7.7)
Neutrophils Relative %: 46 %
PLATELETS: 240 10*3/uL (ref 150–400)
RBC: 5.41 MIL/uL (ref 4.22–5.81)
RDW: 13.1 % (ref 11.5–15.5)
WBC: 5.3 10*3/uL (ref 4.0–10.5)

## 2014-12-09 LAB — BASIC METABOLIC PANEL
Anion gap: 7 (ref 5–15)
BUN: 13 mg/dL (ref 6–20)
CHLORIDE: 103 mmol/L (ref 101–111)
CO2: 23 mmol/L (ref 22–32)
CREATININE: 0.59 mg/dL — AB (ref 0.61–1.24)
Calcium: 8.2 mg/dL — ABNORMAL LOW (ref 8.9–10.3)
GFR calc Af Amer: >60 mL/min (ref >60)
GFR calc non Af Amer: >60 mL/min (ref >60)
Glucose, Bld: 261 mg/dL — ABNORMAL HIGH (ref 65–99)
Potassium: 3.8 mmol/L (ref 3.5–5.1)
Sodium: 133 mmol/L — ABNORMAL LOW (ref 135–145)

## 2014-12-09 MED ORDER — MAGNESIUM SULFATE 2 GM/50ML IV SOLN
2.0000 g | Freq: Once | INTRAVENOUS | Status: AC
  Administered 2014-12-09: 2 g via INTRAVENOUS
  Filled 2014-12-09: qty 50

## 2014-12-09 MED ORDER — DIPHENHYDRAMINE HCL 50 MG/ML IJ SOLN
25.0000 mg | Freq: Once | INTRAMUSCULAR | Status: AC
  Administered 2014-12-09: 25 mg via INTRAVENOUS
  Filled 2014-12-09: qty 1

## 2014-12-09 MED ORDER — SODIUM CHLORIDE 0.9 % IV BOLUS (SEPSIS)
1000.0000 mL | Freq: Once | INTRAVENOUS | Status: AC
  Administered 2014-12-09: 1000 mL via INTRAVENOUS

## 2014-12-09 MED ORDER — METOCLOPRAMIDE HCL 5 MG/ML IJ SOLN
10.0000 mg | Freq: Once | INTRAMUSCULAR | Status: AC
  Administered 2014-12-09: 10 mg via INTRAVENOUS
  Filled 2014-12-09: qty 2

## 2014-12-09 MED ORDER — KETOROLAC TROMETHAMINE 30 MG/ML IJ SOLN
30.0000 mg | Freq: Once | INTRAMUSCULAR | Status: AC
  Administered 2014-12-09: 30 mg via INTRAVENOUS
  Filled 2014-12-09: qty 1

## 2014-12-09 NOTE — ED Notes (Signed)
Pt reporting headache since yesterday morning.  No relief from Tylenol or Ibuprofen.  Reporting sensitivity to light and and sound.  Reporting mild associated nausea.

## 2014-12-09 NOTE — ED Provider Notes (Signed)
CSN: [id number]     Arrival date & time 12/09/14  1903 History   First MD Initiated Contact with Patient 12/09/14 2135     Chief Complaint  Patient presents with  . Migraine     (Consider location/radiation/quality/duration/timing/severity/associated sxs/prior Treatment) HPI   Jose Short. is a 44 y.o. male who presents to the Emergency Department complaining of gradual onset of headache that began yesterday morning.  He describes a generalized throbbing headache "to my whole head"  He reports having headaches in the past, but states they usually do not persist this long.  He has taken Ibuprofen and tylenol without relief.  He also notes sensitivity to light and sound, but mostly light.  He also has nausea.  He denies numbness or weakness, neck pain or stiffness, fever, visual changes, vomiting, chest or abdominal pain   Past Medical History  Diagnosis Date  . Hypertension   . Diabetes mellitus   . Hyperlipidemia    Past Surgical History  Procedure Laterality Date  . Hernia repair     Family History  Problem Relation Age of Onset  . Diabetes Mother   . Hypertension Mother   . Diabetes Father   . Hypertension Father   . Stroke Other    Social History  Substance Use Topics  . Smoking status: Former Smoker -- 3 years    Types: Cigarettes    Quit date: 12/10/1988  . Smokeless tobacco: Current User    Types: Snuff  . Alcohol Use: No    Review of Systems  Constitutional: Negative for fever, activity change and appetite change.  HENT: Negative for facial swelling and trouble swallowing.   Eyes: Positive for photophobia. Negative for pain and visual disturbance.  Respiratory: Negative for shortness of breath.   Cardiovascular: Negative for chest pain.  Gastrointestinal: Negative for nausea and vomiting.  Musculoskeletal: Negative for neck pain and neck stiffness.  Skin: Negative for rash and wound.  Neurological: Positive for headaches. Negative for dizziness,  facial asymmetry, speech difficulty, weakness and numbness.  Psychiatric/Behavioral: Negative for confusion and decreased concentration.  All other systems reviewed and are negative.     Allergies  Penicillins  Home Medications   Prior to Admission medications   Medication Sig Start Date End Date Taking? Authorizing Provider  aspirin EC 325 MG tablet Take 325 mg by mouth daily.   Yes Historical Provider, MD  Multiple Vitamin tablet Take 1 tablet by mouth daily.   Yes Historical Provider, MD  acetaminophen (TYLENOL) 500 MG tablet Take 1,000 mg by mouth every 8 (eight) hours as needed for mild pain. For pain    Historical Provider, MD  azithromycin (ZITHROMAX) 250 MG tablet Take one tablet PO daily starting 07/09/14. Patient not taking: Reported on 12/09/2014 07/08/14   Janne Napoleon, NP  Canagliflozin-Metformin HCl (INVOKAMET) 50-500 MG TABS Take 1 tablet by mouth 2 (two) times daily. Restart after 2 days and take with food Patient not taking: Reported on 12/09/2014 08/04/13   Erick Blinks, MD  cyclobenzaprine (FLEXERIL) 5 MG tablet Take 1 tablet (5 mg total) by mouth 3 (three) times daily as needed for muscle spasms. Patient not taking: Reported on 12/09/2014 10/31/14   Burgess Amor, PA-C  loperamide (IMODIUM) 2 MG capsule Take 1 capsule (2 mg total) by mouth 4 (four) times daily as needed for diarrhea or loose stools. Patient not taking: Reported on 12/09/2014 04/21/14   Layla Maw Ward, DO  metoCLOPramide (REGLAN) 10 MG tablet Take 1 tablet (10  mg total) by mouth 3 (three) times daily as needed for nausea (headache / nausea). Patient not taking: Reported on 12/09/2014 04/28/14   Eber Hong, MD  ondansetron (ZOFRAN ODT) 4 MG disintegrating tablet Take 1 tablet (4 mg total) by mouth every 8 (eight) hours as needed for nausea. Patient not taking: Reported on 12/09/2014 04/28/14   Eber Hong, MD  oxyCODONE-acetaminophen (PERCOCET/ROXICET) 5-325 MG per tablet Take 1 tablet by mouth every 4 (four)  hours as needed. Patient not taking: Reported on 12/09/2014 10/31/14   Burgess Amor, PA-C  promethazine (PHENERGAN) 25 MG tablet Take 1 tablet (25 mg total) by mouth every 6 (six) hours as needed for nausea. 12/11/10 12/18/10  Ivery Quale, PA-C  promethazine (PHENERGAN) 25 MG tablet Take 1 tablet (25 mg total) by mouth every 6 (six) hours as needed for nausea or vomiting. Patient not taking: Reported on 12/09/2014 04/21/14   Kristen N Ward, DO   BP 137/88 mmHg  Pulse 73  Temp(Src) 97.7 F (36.5 C) (Oral)  Resp 18  Ht  (1.88 m)  Wt 262 lb (118.842 kg)  BMI 33.62 kg/m2  SpO2 100% Physical Exam  Constitutional: He is oriented to person, place, and time. He appears well-developed and well-nourished. No distress.  HENT:  Head: Normocephalic and atraumatic.  Mouth/Throat: Oropharynx is clear and moist.  Eyes: EOM are normal. Pupils are equal, round, and reactive to light.  Neck: Normal range of motion and phonation normal. Neck supple. No spinous process tenderness and no muscular tenderness present. No rigidity. No Brudzinski's sign and no Kernig's sign noted.  Cardiovascular: Normal rate, regular rhythm, normal heart sounds and intact distal pulses.   No murmur heard. Pulmonary/Chest: Effort normal and breath sounds normal. No respiratory distress.  Musculoskeletal: Normal range of motion.  Neurological: He is alert and oriented to person, place, and time. He has normal strength. No cranial nerve deficit or sensory deficit. He exhibits normal muscle tone. Coordination and gait normal. GCS eye subscore is 4. GCS verbal subscore is 5. GCS motor subscore is 6.  Reflex Scores:      Tricep reflexes are 2+ on the right side and 2+ on the left side.      Bicep reflexes are 2+ on the right side and 2+ on the left side. Skin: Skin is warm and dry.  Psychiatric: He has a normal mood and affect.  Nursing note and vitals reviewed.   ED Course  Procedures (including critical care time) Labs  Review Labs Reviewed  BASIC METABOLIC PANEL - Abnormal; Notable for the following:    Sodium 133 (*)    Glucose, Bld 261 (*)    Creatinine, Ser 0.59 (*)    Calcium 8.2 (*)    All other components within normal limits  URINALYSIS, ROUTINE W REFLEX MICROSCOPIC (NOT AT [facility]) - Abnormal; Notable for the following:    Glucose, UA >1000 (*)    All other components within normal limits  CBC WITH DIFFERENTIAL/PLATELET  URINE MICROSCOPIC-ADD ON    Imaging Review No results found. I have personally reviewed and evaluated these images and lab results as part of my medical decision-making.   EKG Interpretation None      MDM   Final diagnoses:  None    Pt is well appearing.  Non-toxic.  No focal neuro deficits, no nuchal rigidity. Hx of headaches and reported gradual onset.  On recheck, pt admitted that he is not taking any medication for diabetes and has not had funds for  his medications for greater than 6 months, therefore labs and IVF's were ordered.    0040  Pt is resting comfortably, feeling better.  Headache has improved.  BS is elevated but anion gap is wnml.  He admits to eating doughnuts earlier today and may be the cause of his elevated BS tonight although sugars appear to be chronically elevated.  Clinical suspicion for [facility] or meningitis is low.  I have advised him to follow-up with his PMD in 2-3 days for recheck or to return here for worsening sx's.  He agrees to care plan and appears stable for d/c.     Pauline Aus, PA-C 12/10/14 1610  Laurence Spates, MD 12/10/14 [phone]

## 2014-12-09 NOTE — ED Notes (Signed)
Pt and family updated on plan of care,  

## 2014-12-09 NOTE — ED Notes (Signed)
Tammy PA at bedside,  

## 2014-12-09 NOTE — ED Notes (Signed)
Pt states that his headache is "somewhat" better, pt and family updated on plan of care,

## 2014-12-10 LAB — URINALYSIS, ROUTINE W REFLEX MICROSCOPIC
BILIRUBIN URINE: NEGATIVE
Glucose, UA: >1000 mg/dL — AB
HGB URINE DIPSTICK: NEGATIVE
KETONES UR: NEGATIVE mg/dL
Leukocytes, UA: NEGATIVE
NITRITE: NEGATIVE
PH: 5.5 (ref 5.0–8.0)
Protein, ur: NEGATIVE mg/dL
Specific Gravity, Urine: 1.025 (ref 1.005–1.030)
Urobilinogen, UA: 0.2 mg/dL (ref 0.0–1.0)

## 2014-12-10 LAB — URINE MICROSCOPIC-ADD ON

## 2014-12-10 NOTE — Discharge Instructions (Signed)
High Blood Sugar High blood sugar (hyperglycemia) means that the level of sugar in your blood is higher than it should be. Signs of high blood sugar include:  Feeling thirsty.  Frequent peeing (urinating).  Feeling tired or sleepy.  Dry mouth.  Vision changes.  Feeling weak.  Feeling hungry but losing weight.  Numbness and tingling in your hands or feet.  Headache. When you ignore these signs, your blood sugar may keep going up. These problems may get worse, and other problems may begin. HOME CARE  Check your blood sugars as told by your doctor. Write down the numbers with the date and time.  Take the right amount of insulin or diabetes pills at the right time. Write down the dose with date and time.  Refill your insulin or diabetes pills before running out.  Watch what you eat. Follow your meal plan.  Drink liquids without sugar, such as water. Check with your doctor if you have kidney or heart disease.  Follow your doctor's orders for exercise. Exercise at the same time of day.  Keep your doctor's appointments. GET HELP RIGHT AWAY IF:   You have trouble thinking or are confused.  You have fast breathing with fruity smelling breath.  You pass out (faint).  You have 2 to 3 days of high blood sugars and you do not know why.  You have chest pain.  You are feeling sick to your stomach (nauseous) or throwing up (vomiting).  You have sudden vision changes. MAKE SURE YOU:   Understand these instructions.  Will watch your condition.  Will get help right away if you are not doing well or get worse. Document Released: 12/24/2008 Document Revised: 05/21/2011 Document Reviewed: 12/24/2008 [facility] Patient Information 2015 [place], [place]. This information is not intended to replace advice given to you by your health care provider. Make sure you discuss any questions you have with your health care provider.  Migraine Headache A migraine headache is very bad,  throbbing pain on one or both sides of your head. Talk to your doctor about what things may bring on (trigger) your migraine headaches. HOME CARE  Only take medicines as told by your doctor.  Lie down in a dark, quiet room when you have a migraine.  Keep a journal to find out if certain things bring on migraine headaches. For example, write down:  What you eat and drink.  How much sleep you get.  Any change to your diet or medicines.  Lessen how much alcohol you drink.  Quit smoking if you smoke.  Get enough sleep.  Lessen any stress in your life.  Keep lights dim if bright lights bother you or make your migraines worse. GET HELP RIGHT AWAY IF:   Your migraine becomes really bad.  You have a fever.  You have a stiff neck.  You have trouble seeing.  Your muscles are weak, or you lose muscle control.  You lose your balance or have trouble walking.  You feel like you will pass out (faint), or you pass out.  You have really bad symptoms that are different than your first symptoms. MAKE SURE YOU:   Understand these instructions.  Will watch your condition.  Will get help right away if you are not doing well or get worse. Document Released: 12/06/2007 Document Revised: 05/21/2011 Document Reviewed: 11/03/2012 [facility] Patient Information 2015 [place], [place]. This information is not intended to replace advice given to you by your health care provider. Make sure you discuss any  questions you have with your health care provider. ° °

## 2014-12-28 ENCOUNTER — Encounter (HOSPITAL_COMMUNITY): Payer: Self-pay | Admitting: Emergency Medicine

## 2014-12-28 ENCOUNTER — Emergency Department (HOSPITAL_COMMUNITY)
Admission: EM | Admit: 2014-12-28 | Discharge: 2014-12-28 | Disposition: A | Discharge status: Home / Self Care | Payer: Self-pay | Attending: Emergency Medicine | Admitting: Emergency Medicine

## 2014-12-28 DIAGNOSIS — Z87891 Personal history of nicotine dependence: Secondary | ICD-10-CM | POA: Insufficient documentation

## 2014-12-28 DIAGNOSIS — Y9289 Other specified places as the place of occurrence of the external cause: Secondary | ICD-10-CM | POA: Insufficient documentation

## 2014-12-28 DIAGNOSIS — L03011 Cellulitis of right finger: Secondary | ICD-10-CM

## 2014-12-28 DIAGNOSIS — E119 Type 2 diabetes mellitus without complications: Secondary | ICD-10-CM | POA: Insufficient documentation

## 2014-12-28 DIAGNOSIS — Z79899 Other long term (current) drug therapy: Secondary | ICD-10-CM | POA: Insufficient documentation

## 2014-12-28 DIAGNOSIS — Y9389 Activity, other specified: Secondary | ICD-10-CM | POA: Insufficient documentation

## 2014-12-28 DIAGNOSIS — Z7982 Long term (current) use of aspirin: Secondary | ICD-10-CM | POA: Insufficient documentation

## 2014-12-28 DIAGNOSIS — Z23 Encounter for immunization: Secondary | ICD-10-CM | POA: Insufficient documentation

## 2014-12-28 DIAGNOSIS — Z880 Allergy status to penicillin: Secondary | ICD-10-CM | POA: Insufficient documentation

## 2014-12-28 DIAGNOSIS — X58XXXA Exposure to other specified factors, initial encounter: Secondary | ICD-10-CM | POA: Insufficient documentation

## 2014-12-28 DIAGNOSIS — Y998 Other external cause status: Secondary | ICD-10-CM | POA: Insufficient documentation

## 2014-12-28 DIAGNOSIS — I10 Essential (primary) hypertension: Secondary | ICD-10-CM | POA: Insufficient documentation

## 2014-12-28 MED ORDER — TETANUS-DIPHTH-ACELL PERTUSSIS 5-2.5-18.5 LF-MCG/0.5 IM SUSP
0.5000 mL | Freq: Once | INTRAMUSCULAR | Status: AC
  Administered 2014-12-28: 0.5 mL via INTRAMUSCULAR
  Filled 2014-12-28: qty 0.5

## 2014-12-28 MED ORDER — SULFAMETHOXAZOLE-TRIMETHOPRIM 800-160 MG PO TABS
1.0000 | ORAL_TABLET | Freq: Once | ORAL | Status: AC
  Administered 2014-12-28: 1 via ORAL
  Filled 2014-12-28: qty 1

## 2014-12-28 MED ORDER — LIDOCAINE HCL (PF) 1 % IJ SOLN
INTRAMUSCULAR | Status: AC
  Filled 2014-12-28: qty 5

## 2014-12-28 MED ORDER — HYDROCODONE-ACETAMINOPHEN 5-325 MG PO TABS: 1.0000 | ORAL_TABLET | ORAL | Status: DC | PRN

## 2014-12-28 MED ORDER — HYDROCODONE-ACETAMINOPHEN 5-325 MG PO TABS
1.0000 | ORAL_TABLET | Freq: Once | ORAL | Status: AC
  Administered 2014-12-28: 1 via ORAL
  Filled 2014-12-28: qty 1

## 2014-12-28 MED ORDER — SULFAMETHOXAZOLE-TRIMETHOPRIM 800-160 MG PO TABS: 1.0000 | ORAL_TABLET | Freq: Two times a day (BID) | ORAL | Status: AC

## 2014-12-28 NOTE — Discharge Instructions (Signed)
Paronychia  °Paronychia is an infection of the skin. It happens near a fingernail or toenail. It may cause pain and swelling around the nail. Usually, it is not serious and it clears up with treatment. °HOME CARE °· Soak the fingers or toes in warm water as told by your doctor. You may be told to do this for 20 minutes, 2-3 times a day. °· Keep the area dry when you are not soaking it. °· Take medicines only as told by your doctor. °· If you were given an antibiotic medicine, finish all of it even if you start to feel better. °· Keep the affected area clean. °· Do not try to drain a fluid-filled bump yourself. °· Wear rubber gloves when putting your hands in water. °· Wear gloves if your hands might touch cleaners or chemicals. °· Follow your doctor's instructions about: °¨ Wound care. °¨ Bandage (dressing) changes and removal. °GET HELP IF: °· Your symptoms get worse or do not improve. °· You have a fever or chills. °· You have redness spreading from the affected area. °· You have more fluid, blood, or pus coming from the affected area. °· Your finger or knuckle is swollen or is hard to move. °  °This information is not intended to replace advice given to you by your health care provider. Make sure you discuss any questions you have with your health care provider. °  °Document Released: 02/14/2009 Document Revised: 07/13/2014 Document Reviewed: 02/03/2014 °Elsevier Interactive Patient Education ©2016 [employer] ° °

## 2014-12-28 NOTE — ED Notes (Signed)
Pt cut finger nail down to far in last week.  Pt notice redness and drainage.  Pt got pus out with needle at home.  And pt hit finger last night.  Area red and tender.

## 2014-12-28 NOTE — ED Provider Notes (Addendum)
CSN: [id number]     Arrival date & time 12/28/14  1827 History   First MD Initiated Contact with Patient 12/28/14 1845     Chief Complaint  Patient presents with  . Finger Injury     (Consider location/radiation/quality/duration/timing/severity/associated sxs/prior Treatment) Patient is a 44 y.o. male presenting with hand pain. The history is provided by the patient.  Hand Pain This is a new problem. The current episode started in the past 7 days. The problem occurs constantly. The problem has been gradually worsening.   Jose PressmanWilliam F Fata Jr. is a 44 y.o. male who presents to the ED with right little finger pain, swelling and pain that started last week. Patient reports that he trimmed his nail down to far last week and after that began noting pain and redness. Patient states that he took a needed and tried to open the area and did get some drainage out but today is worse. He has been soaking his finger in peroxide and now a small area of skin is peeling.    Past Medical History  Diagnosis Date  . Hypertension   . Diabetes mellitus   . Hyperlipidemia    Past Surgical History  Procedure Laterality Date  . Hernia repair     Family History  Problem Relation Age of Onset  . Diabetes Mother   . Hypertension Mother   . Diabetes Father   . Hypertension Father   . Stroke Other    Social History  Substance Use Topics  . Smoking status: Former Smoker -- 3 years    Types: Cigarettes    Quit date: 12/10/1988  . Smokeless tobacco: Current User    Types: Snuff  . Alcohol Use: No    Review of Systems Negative except as stated in HPI   Allergies  Penicillins  Home Medications   Prior to Admission medications   Medication Sig Start Date End Date Taking? Authorizing Provider  aspirin EC 325 MG tablet Take 325 mg by mouth daily.   Yes Historical Provider, MD  ibuprofen (ADVIL,MOTRIN) 200 MG tablet Take 200 mg by mouth every 6 (six) hours as needed for mild pain or moderate pain.    Yes Historical Provider, MD  Multiple Vitamin tablet Take 1 tablet by mouth daily.   Yes Historical Provider, MD  Potassium 99 MG TABS Take 1 tablet by mouth daily.   Yes Historical Provider, MD  HYDROcodone-acetaminophen (NORCO/VICODIN) 5-325 MG tablet Take 1 tablet by mouth every 4 (four) hours as needed. 12/28/14   Jose Short Jose Short Jose Fischbach, NP  sulfamethoxazole-trimethoprim (BACTRIM DS,SEPTRA DS) 800-160 MG tablet Take 1 tablet by mouth 2 (two) times daily. 12/28/14 01/04/15  Jose Short Jose Short Jose Redder, NP   BP 131/91 mmHg  Pulse 74  Temp(Src) 97.9 F (36.6 C) (Oral)  Resp 16  Ht 6\' 2"  (1.88 m)  Wt 264 lb (119.75 kg)  BMI 33.88 kg/m2  SpO2 99% Physical Exam  Constitutional: He is oriented to person, place, and time. He appears well-developed and well-nourished.  HENT:  Head: Normocephalic.  Eyes: EOM are normal.  Neck: Neck supple.  Cardiovascular: Normal rate.   Pulmonary/Chest: Effort normal.  Musculoskeletal: Normal range of motion.       Right hand: He exhibits tenderness and swelling. He exhibits normal range of motion, normal capillary refill and no deformity. Normal sensation noted. Normal strength noted.       Hands: Neurological: He is alert and oriented to person, place, and time. No cranial nerve deficit.  Skin: Skin is  warm and dry.  Psychiatric: He has a normal mood and affect. His behavior is normal.  Nursing note and vitals reviewed.   ED Course  .Marland KitchenIncision and Drainage Date/Time: 12/28/2014 7:15 PM Performed by: Jose Short Authorized by: Jose Short Consent: Verbal consent obtained. Risks and benefits: risks, benefits and alternatives were discussed Consent given by: patient Patient understanding: patient states understanding of the procedure being performed Required items: required blood products, implants, devices, and special equipment available Patient identity confirmed: verbally with patient Indications for incision and drainage: paronychia. Body area: upper  extremity Location details: right small finger Anesthesia: digital block Local anesthetic: lidocaine 1% without epinephrine Anesthetic total: 3 ml Patient sedated: no Scalpel size: 11 Incision type: single straight Incision depth: subcutaneous Complexity: simple Drainage: purulent and  bloody Drainage amount: scant Wound treatment: wound left open Patient tolerance: Patient tolerated the procedure well with no immediate complications Comments: Tetanus updated   MDM  44 y.o. male with pain redness and swelling to the right little finger. Stable for d/c without focal neuro deficits, no fever and no red streaking. Will treat with antibiotics and pain medication and he will continue to soak the finger with warm water. He will stops peroxide soaks. He will return as needed for any problems.   Final diagnoses:  Paronychia, right      [facility], NP 12/28/14 1951  Jose Berkshire, MD 12/28/14 [address] [place], NP 01/22/15 [id number]  Jose Berkshire, MD 01/24/15 [phone]

## 2015-01-11 ENCOUNTER — Emergency Department (HOSPITAL_COMMUNITY)
Admission: EM | Admit: 2015-01-11 | Discharge: 2015-01-11 | Disposition: A | Discharge status: Home / Self Care | Payer: Self-pay | Attending: Emergency Medicine | Admitting: Emergency Medicine

## 2015-01-11 ENCOUNTER — Encounter (HOSPITAL_COMMUNITY): Payer: Self-pay | Admitting: Emergency Medicine

## 2015-01-11 DIAGNOSIS — Z7982 Long term (current) use of aspirin: Secondary | ICD-10-CM | POA: Insufficient documentation

## 2015-01-11 DIAGNOSIS — Z880 Allergy status to penicillin: Secondary | ICD-10-CM | POA: Insufficient documentation

## 2015-01-11 DIAGNOSIS — R05 Cough: Secondary | ICD-10-CM | POA: Insufficient documentation

## 2015-01-11 DIAGNOSIS — E119 Type 2 diabetes mellitus without complications: Secondary | ICD-10-CM | POA: Insufficient documentation

## 2015-01-11 DIAGNOSIS — R112 Nausea with vomiting, unspecified: Secondary | ICD-10-CM | POA: Insufficient documentation

## 2015-01-11 DIAGNOSIS — R0981 Nasal congestion: Secondary | ICD-10-CM | POA: Insufficient documentation

## 2015-01-11 DIAGNOSIS — Z79899 Other long term (current) drug therapy: Secondary | ICD-10-CM | POA: Insufficient documentation

## 2015-01-11 DIAGNOSIS — E860 Dehydration: Secondary | ICD-10-CM | POA: Insufficient documentation

## 2015-01-11 DIAGNOSIS — I10 Essential (primary) hypertension: Secondary | ICD-10-CM | POA: Insufficient documentation

## 2015-01-11 DIAGNOSIS — E785 Hyperlipidemia, unspecified: Secondary | ICD-10-CM | POA: Insufficient documentation

## 2015-01-11 DIAGNOSIS — R51 Headache: Secondary | ICD-10-CM | POA: Insufficient documentation

## 2015-01-11 DIAGNOSIS — Z87891 Personal history of nicotine dependence: Secondary | ICD-10-CM | POA: Insufficient documentation

## 2015-01-11 DIAGNOSIS — R42 Dizziness and giddiness: Secondary | ICD-10-CM | POA: Insufficient documentation

## 2015-01-11 LAB — CBC WITH DIFFERENTIAL/PLATELET
BASOS ABS: 0 10*3/uL (ref 0.0–0.1)
BASOS PCT: 1 %
EOS ABS: 0.1 10*3/uL (ref 0.0–0.7)
EOS PCT: 2 %
HCT: 43.4 % (ref 39.0–52.0)
Hemoglobin: 14.6 g/dL (ref 13.0–17.0)
Lymphocytes Relative: 38 %
Lymphs Abs: 1.9 10*3/uL (ref 0.7–4.0)
MCH: 27.4 pg (ref 26.0–34.0)
MCHC: 33.6 g/dL (ref 30.0–36.0)
MCV: 81.6 fL (ref 78.0–100.0)
MONO ABS: 0.4 10*3/uL (ref 0.1–1.0)
Monocytes Relative: 9 %
NEUTROS PCT: 50 %
Neutro Abs: 2.5 10*3/uL (ref 1.7–7.7)
PLATELETS: 296 10*3/uL (ref 150–400)
RBC: 5.32 MIL/uL (ref 4.22–5.81)
RDW: 12.8 % (ref 11.5–15.5)
WBC: 4.9 10*3/uL (ref 4.0–10.5)

## 2015-01-11 LAB — BASIC METABOLIC PANEL
Anion gap: 8 (ref 5–15)
BUN: 10 mg/dL (ref 6–20)
CALCIUM: 9.3 mg/dL (ref 8.9–10.3)
CO2: 28 mmol/L (ref 22–32)
CREATININE: 0.68 mg/dL (ref 0.61–1.24)
Chloride: 99 mmol/L — ABNORMAL LOW (ref 101–111)
Glucose, Bld: 344 mg/dL — ABNORMAL HIGH (ref 65–99)
Potassium: 4.1 mmol/L (ref 3.5–5.1)
SODIUM: 135 mmol/L (ref 135–145)

## 2015-01-11 LAB — CBG MONITORING, ED: Glucose-Capillary: 330 mg/dL — ABNORMAL HIGH (ref 65–99)

## 2015-01-11 MED ORDER — ACETAMINOPHEN 500 MG PO TABS
1000.0000 mg | ORAL_TABLET | Freq: Once | ORAL | Status: AC
  Administered 2015-01-11: 1000 mg via ORAL
  Filled 2015-01-11: qty 2

## 2015-01-11 MED ORDER — SODIUM CHLORIDE 0.9 % IV BOLUS (SEPSIS)
2000.0000 mL | Freq: Once | INTRAVENOUS | Status: AC
  Administered 2015-01-11: 2000 mL via INTRAVENOUS

## 2015-01-11 MED ORDER — ONDANSETRON HCL 4 MG/2ML IJ SOLN
4.0000 mg | Freq: Once | INTRAMUSCULAR | Status: AC
  Administered 2015-01-11: 4 mg via INTRAVENOUS
  Filled 2015-01-11: qty 2

## 2015-01-11 NOTE — ED Provider Notes (Signed)
CSN: [id number]     Arrival date & time 01/11/15  4098 History   First MD Initiated Contact with Patient 01/11/15 0725     Chief Complaint  Patient presents with  . Headache     (Consider location/radiation/quality/duration/timing/severity/associated sxs/prior Treatment) HPI  44 year old male presents with nausea and vomiting since yesterday. He skipped work yesterday because of how bad he fell. He thinks he threw up 3 or 4 times. Other times he has felt persistently nauseated. Developed a gradually worsening headache starting last night that feels like a sinus headache he has had in the past. Feels pressure in his for head and over maxilla. Has had mild congestion and over the last 12-24 hours he is developing a cough. Patient states that he thought he was a little bit better this morning so he drove his wife to work. 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 There was no room spinning sensation. The headache did not become acutely worse. He does have a grandson that has been diagnosed with an ear infection and has been having a cough. Earlier in the illness the child was also having vomiting after eating. The patient denies abdominal pain, chest pain, or diarrhea.  Past Medical History  Diagnosis Date  . Hypertension   . Diabetes mellitus   . Hyperlipidemia    Past Surgical History  Procedure Laterality Date  . Hernia repair     Family History  Problem Relation Age of Onset  . Diabetes Mother   . Hypertension Mother   . Diabetes Father   . Hypertension Father   . Stroke Other    Social History  Substance Use Topics  . Smoking status: Former Smoker -- 3 years    Types: Cigarettes    Quit date: 12/10/1988  . Smokeless tobacco: Current User    Types: Snuff  . Alcohol Use: No    Review of Systems  Constitutional: Negative for fever.  Respiratory: Positive for cough. Negative for shortness of breath.    Cardiovascular: Negative for chest pain.  Gastrointestinal: Positive for nausea and vomiting. Negative for abdominal pain and diarrhea.  Neurological: Positive for light-headedness and headaches. Negative for syncope.  All other systems reviewed and are negative.     Allergies  Penicillins  Home Medications   Prior to Admission medications   Medication Sig Start Date End Date Taking? Authorizing Provider  aspirin EC 325 MG tablet Take 325 mg by mouth daily.    Historical Provider, MD  HYDROcodone-acetaminophen (NORCO/VICODIN) 5-325 MG tablet Take 1 tablet by mouth every 4 (four) hours as needed. 12/28/14   Hope Orlene Och, NP  ibuprofen (ADVIL,MOTRIN) 200 MG tablet Take 200 mg by mouth every 6 (six) hours as needed for mild pain or moderate pain.    Historical Provider, MD  Multiple Vitamin tablet Take 1 tablet by mouth daily.    Historical Provider, MD  Potassium 99 MG TABS Take 1 tablet by mouth daily.    Historical Provider, MD   BP 141/97 mmHg  Pulse 61  Temp(Src) 97.7 F (36.5 C) (Oral)  Resp 18  Ht  (1.88 m)  Wt 260 lb (117.935 kg)  BMI 33.37 kg/m2  SpO2 97% Physical Exam  Constitutional: He is oriented to person, place, and time. He appears well-developed and well-nourished. No distress.  HENT:  Head: Normocephalic and atraumatic.  Right Ear: External ear normal.  Left Ear: External ear normal.  Nose: Nose normal.  Mouth/Throat: Mucous membranes are dry.  Eyes: EOM are normal. Pupils are equal, round, and reactive to light. Right eye exhibits no discharge. Left eye exhibits no discharge.  Neck: Neck supple.  Cardiovascular: Normal rate, regular rhythm, normal heart sounds and intact distal pulses.   No murmur heard. Pulmonary/Chest: Effort normal and breath sounds normal.  Abdominal: Soft. He exhibits no distension. There is no tenderness.  Musculoskeletal: He exhibits no edema.  Neurological: He is alert and oriented to person, place, and time.  CN 2-12  grossly intact. 5/5 strength in all 4 extremities. Grossly normal sensation. Normal finger to nose.  Skin: Skin is warm and dry. He is not diaphoretic.  Nursing note and vitals reviewed.   ED Course  Procedures (including critical care time) Labs Review Labs Reviewed  BASIC METABOLIC PANEL - Abnormal; Notable for the following:    Chloride 99 (*)    Glucose, Bld 344 (*)    All other components within normal limits  CBG MONITORING, ED - Abnormal; Notable for the following:    Glucose-Capillary 330 (*)    All other components within normal limits  CBC WITH DIFFERENTIAL/PLATELET    Imaging Review No results found. I have personally reviewed and evaluated these images and lab results as part of my medical decision-making.   EKG Interpretation   Date/Time:  Tuesday January 11 2015 07:57:31 EDT Ventricular Rate:  60 PR Interval:  172 QRS Duration: 133 QT Interval:  433 QTC Calculation: 433 R Axis:   30 Text Interpretation:  Sinus rhythm Nonspecific intraventricular conduction  delay Abnormal inferior Q waves no significant change since Mar 2012  Confirmed by Criss AlvineGOLDSTON  MD, Jasen Hartstein ([id number]) on 01/11/2015 8:30:35 AM      MDM   Final diagnoses:  Nausea and vomiting in adult  Dehydration    I believe patient's near syncopal symptoms are related to dehydration from nausea and vomiting and poor oral intake. Neuro exam is normal here. Has a mild to moderate headache that has progressively worsened but does not sound consistent with an acute stroke or subarachnoid hemorrhage. No trauma. No indication for CT at this time. No chest symptoms. Feels better after IV fluids and Zofran. He states he has nausea medicine at home for symptom control. I believe most likely is suffering from a viral illness that he likely caught from his 4235-month-old grandson. Discussed strict return precautions.    Pricilla LovelessScott Davaun Quintela, MD 01/11/15 [phone]

## 2015-01-11 NOTE — ED Notes (Signed)
Pt reports headache, generalized weakness and fatigue for last several days. Pt reports nausea and vomiting this am.

## 2015-03-16 ENCOUNTER — Encounter: Payer: Self-pay | Admitting: Internal Medicine

## 2015-03-29 ENCOUNTER — Ambulatory Visit: Payer: Self-pay | Admitting: Gastroenterology

## 2015-04-01 ENCOUNTER — Emergency Department (HOSPITAL_COMMUNITY)
Admission: EM | Admit: 2015-04-01 | Discharge: 2015-04-01 | Disposition: A | Discharge status: Home / Self Care | Payer: BLUE CROSS/BLUE SHIELD | Attending: Emergency Medicine | Admitting: Emergency Medicine

## 2015-04-01 ENCOUNTER — Encounter (HOSPITAL_COMMUNITY): Payer: Self-pay

## 2015-04-01 DIAGNOSIS — W268XXA Contact with other sharp object(s), not elsewhere classified, initial encounter: Secondary | ICD-10-CM | POA: Insufficient documentation

## 2015-04-01 DIAGNOSIS — Z880 Allergy status to penicillin: Secondary | ICD-10-CM | POA: Diagnosis not present

## 2015-04-01 DIAGNOSIS — Z7982 Long term (current) use of aspirin: Secondary | ICD-10-CM | POA: Diagnosis not present

## 2015-04-01 DIAGNOSIS — Z79899 Other long term (current) drug therapy: Secondary | ICD-10-CM | POA: Diagnosis not present

## 2015-04-01 DIAGNOSIS — S61032A Puncture wound without foreign body of left thumb without damage to nail, initial encounter: Secondary | ICD-10-CM | POA: Insufficient documentation

## 2015-04-01 DIAGNOSIS — Y9389 Activity, other specified: Secondary | ICD-10-CM | POA: Diagnosis not present

## 2015-04-01 DIAGNOSIS — Y998 Other external cause status: Secondary | ICD-10-CM | POA: Diagnosis not present

## 2015-04-01 DIAGNOSIS — Z8639 Personal history of other endocrine, nutritional and metabolic disease: Secondary | ICD-10-CM | POA: Insufficient documentation

## 2015-04-01 DIAGNOSIS — S61432A Puncture wound without foreign body of left hand, initial encounter: Secondary | ICD-10-CM

## 2015-04-01 DIAGNOSIS — Y9289 Other specified places as the place of occurrence of the external cause: Secondary | ICD-10-CM | POA: Insufficient documentation

## 2015-04-01 DIAGNOSIS — I10 Essential (primary) hypertension: Secondary | ICD-10-CM | POA: Diagnosis not present

## 2015-04-01 DIAGNOSIS — Z87891 Personal history of nicotine dependence: Secondary | ICD-10-CM | POA: Insufficient documentation

## 2015-04-01 DIAGNOSIS — E119 Type 2 diabetes mellitus without complications: Secondary | ICD-10-CM | POA: Insufficient documentation

## 2015-04-01 DIAGNOSIS — S6992XA Unspecified injury of left wrist, hand and finger(s), initial encounter: Secondary | ICD-10-CM | POA: Diagnosis present

## 2015-04-01 MED ORDER — SULFAMETHOXAZOLE-TRIMETHOPRIM 800-160 MG PO TABS
1.0000 | ORAL_TABLET | Freq: Once | ORAL | Status: AC
  Administered 2015-04-01: 1 via ORAL
  Filled 2015-04-01: qty 1

## 2015-04-01 MED ORDER — LIDOCAINE HCL (PF) 2 % IJ SOLN
10.0000 mL | Freq: Once | INTRAMUSCULAR | Status: DC
  Filled 2015-04-01: qty 10

## 2015-04-01 MED ORDER — SULFAMETHOXAZOLE-TRIMETHOPRIM 800-160 MG PO TABS: 1.0000 | ORAL_TABLET | Freq: Two times a day (BID) | ORAL | Status: AC

## 2015-04-01 NOTE — ED Notes (Signed)
Pt reports was moving a heavy card board box and the box started to slip and he cut the palm of his left hand on the corner of the box.  Bleeding controlled with multiple bandaids.

## 2015-04-01 NOTE — Discharge Instructions (Signed)
Puncture Wound A puncture wound is an injury that is caused by a sharp, thin object that goes through (penetrates) your skin. Usually, a puncture wound does not leave a large opening in your skin, so it may not bleed a lot. However, when you get a puncture wound, dirt or other materials (foreign bodies) can be forced into your wound and break off inside. This increases the chance of infection, such as tetanus. CAUSES Puncture wounds are caused by any sharp, thin object that goes through your skin, such as:  Animal teeth, as with an animal bite.  Sharp, pointed objects, such as nails, splinters of glass, fishhooks, and needles. SYMPTOMS Symptoms of a puncture wound include:  Pain.  Bleeding.  Swelling.  Bruising.  Fluid leaking from the wound.  Numbness, tingling, or loss of function. DIAGNOSIS This condition is diagnosed with a medical history and physical exam. Your wound will be checked to see if it contains any foreign bodies. You may also have X-rays or other imaging tests. TREATMENT Treatment for a puncture wound depends on how serious the wound is. It also depends on whether the wound contains any foreign bodies. Treatment for all types of puncture wounds usually starts with:  Controlling the bleeding.  Washing out the wound with a germ-free (sterile) salt-water solution.  Checking the wound for foreign bodies. Treatment may also include:  Having the wound opened surgically to remove a foreign object.  Closing the wound with stitches (sutures) if it continues to bleed.  Covering the wound with antibiotic ointments and a bandage (dressing).  Receiving a tetanus shot.  Receiving a rabies vaccine. HOME CARE INSTRUCTIONS Medicines  Take or apply over-the-counter and prescription medicines only as told by your health care provider.  If you were prescribed an antibiotic, take or apply it as told by your health care provider. Do not stop using the antibiotic even if  your condition improves. Wound Care  There are many ways to close and cover a wound. For example, a wound can be covered with sutures, skin glue, or adhesive strips. Follow instructions from your health care provider about:  How to take care of your wound.  When and how you should change your dressing.  When you should remove your dressing.  Removing whatever was used to close your wound.  Keep the dressing dry as told by your health care provider. Do not take baths, swim, use a hot tub, or do anything that would put your wound underwater until your health care provider approves.  Clean the wound as told by your health care provider.  Do not scratch or pick at the wound.  Check your wound every day for signs of infection. Watch for:  Redness, swelling, or pain.  Fluid, blood, or pus. General Instructions  Raise (elevate) the injured area above the level of your heart while you are sitting or lying down.  If your puncture wound is in your foot, ask your health care provider if you need to avoid putting weight on your foot and for how long.  Keep all follow-up visits as told by your health care provider. This is important. SEEK MEDICAL CARE IF:  You received a tetanus shot and you have swelling, severe pain, redness, or bleeding at the injection site.  You have a fever.  Your sutures come out.  You notice a bad smell coming from your wound or your dressing.  You notice something coming out of your wound, such as wood or glass.  Your   pain is not controlled with medicine.  You have increased redness, swelling, or pain at the site of your wound.  You have fluid, blood, or pus coming from your wound.  You notice a change in the color of your skin near your wound.  You need to change the dressing frequently due to fluid, blood, or pus draining from your wound.  You develop a new rash.  You develop numbness around your wound. SEEK IMMEDIATE MEDICAL CARE IF:  You  develop severe swelling around your wound.  Your pain suddenly increases and is severe.  You develop painful skin lumps.  You have a red streak going away from your wound.  The wound is on your hand or foot and you cannot properly move a finger or toe.  The wound is on your hand or foot and you notice that your fingers or toes look pale or bluish.   This information is not intended to replace advice given to you by your health care provider. Make sure you discuss any questions you have with your health care provider.   Document Released: 12/06/2004 Document Revised: 11/17/2014 Document Reviewed: 04/21/2014 Elsevier Interactive Patient Education 2016 [employer]  

## 2015-04-04 NOTE — ED Provider Notes (Signed)
CSN: [id number]     Arrival date & time 04/01/15  1650 History   First MD Initiated Contact with Patient 04/01/15 1715     Chief Complaint  Patient presents with  . Laceration     (Consider location/radiation/quality/duration/timing/severity/associated sxs/prior Treatment) HPI  Jose Short. is a 45 y.o. male who presents to the Emergency Department complaining of puncture wound to the left thumb.  States he was moving a heavy box and cut his thumb on the corner of the box.  He denies pain to the wound, but reports that he had a apply several bandaids to control the bleeding.  Reports TD is UTD.  He denies numbness or weakness of the finger, swelling, and anti-coagulant use.     Past Medical History  Diagnosis Date  . Hypertension   . Diabetes mellitus   . Hyperlipidemia    Past Surgical History  Procedure Laterality Date  . Hernia repair     Family History  Problem Relation Age of Onset  . Diabetes Mother   . Hypertension Mother   . Diabetes Father   . Hypertension Father   . Stroke Other    Social History  Substance Use Topics  . Smoking status: Former Smoker -- 3 years    Types: Cigarettes    Quit date: 12/10/1988  . Smokeless tobacco: Current User    Types: Snuff  . Alcohol Use: No    Review of Systems  Constitutional: Negative for fever and chills.  Musculoskeletal: Negative for joint swelling and arthralgias.  Skin: Positive for wound.       Puncture wound to the left thumb.    Neurological: Negative for dizziness, weakness and numbness.  Hematological: Does not bruise/bleed easily.  All other systems reviewed and are negative.     Allergies  Penicillins  Home Medications   Prior to Admission medications   Medication Sig Start Date End Date Taking? Authorizing Provider  acetaminophen (TYLENOL) 500 MG tablet Take 1,000 mg by mouth every 6 (six) hours as needed for headache.    Historical Provider, MD  aspirin EC 325 MG tablet Take 325 mg by  mouth daily.    Historical Provider, MD  HYDROcodone-acetaminophen (NORCO/VICODIN) 5-325 MG tablet Take 1 tablet by mouth every 4 (four) hours as needed. Patient not taking: Reported on 01/11/2015 12/28/14   Janne Napoleon, NP  ibuprofen (ADVIL,MOTRIN) 200 MG tablet Take 200 mg by mouth every 6 (six) hours as needed for mild pain or moderate pain.    Historical Provider, MD  Multiple Vitamin tablet Take 1 tablet by mouth daily.    Historical Provider, MD  Potassium 99 MG TABS Take 1 tablet by mouth daily.    Historical Provider, MD  sulfamethoxazole-trimethoprim (BACTRIM DS,SEPTRA DS) 800-160 MG tablet Take 1 tablet by mouth 2 (two) times daily. For 7 days 04/01/15 04/08/15  Maudell Stanbrough, PA-C   BP 135/94 mmHg  Pulse 71  Temp(Src) 98.7 F (37.1 C) (Tympanic)  Resp 18  Ht  (1.88 m)  Wt 122.471 kg  BMI 34.65 kg/m2  SpO2 100% Physical Exam  Constitutional: He is oriented to person, place, and time. He appears well-developed and well-nourished. No distress.  HENT:  Head: Normocephalic and atraumatic.  Cardiovascular: Normal rate, regular rhythm and intact distal pulses.   Pulmonary/Chest: Effort normal and breath sounds normal. No respiratory distress.  Musculoskeletal: He exhibits no edema.       Left hand: He exhibits tenderness. He exhibits no swelling. Normal sensation  noted. Normal strength noted. He exhibits no thumb/finger opposition.  No edema.  Pt has full ROM of the thumb, distal sensation intact, CR< 2 sec  Neurological: He is alert and oriented to person, place, and time. He exhibits normal muscle tone. Coordination normal.  Skin: Skin is warm.  Puncture wound to the left proximal left thumb.  Bleeding controlled, no edema.    Nursing note and vitals reviewed.   ED Course  Procedures (including critical care time) Labs Review Labs Reviewed - No data to display  Imaging Review No results found. I have personally reviewed and evaluated these images and lab results as  part of my medical decision-making.   EKG Interpretation None       LACERATION REPAIR Performed by: Hera Celaya L. Authorized by: Maxwell Caul Consent: Verbal consent obtained. Risks and benefits: risks, benefits and alternatives were discussed Consent given by: patient Patient identity confirmed: provided demographic data Prepped and Draped in normal sterile fashion Wound explored  Laceration Location: left hand  Laceration Length: <1 cm  No Foreign Bodies seen or palpated  Anesthesia: local infiltration  Local anesthetic: lidocaine 2% w/o epinephrine  Anesthetic total: 2 ml  Irrigation method: syringe Amount of cleaning: standard  Skin closure: none  Number of sutures: 0  Wound was left opened, irrigated thoroughly with nml saline.  No FB's seen.    Patient tolerance: Patient tolerated the procedure well with no immediate complications.   MDM   Final diagnoses:  Puncture wound, hand, left, initial encounter    Puncture wound to left hand.  NV and NS intact.  No motor deficits.  Wound allowed to heal by secondary intent.  Rx for bactrim.  Dressing applied.  Advised to return for signs of infection.     Pauline Aus, PA-C 04/04/15 2257  Cathren Laine, MD 04/05/15 [phone]

## 2015-04-14 ENCOUNTER — Telehealth: Payer: Self-pay | Admitting: Gastroenterology

## 2015-04-14 ENCOUNTER — Encounter: Payer: Self-pay | Admitting: Gastroenterology

## 2015-04-14 ENCOUNTER — Ambulatory Visit: Payer: Self-pay | Admitting: Gastroenterology

## 2015-04-14 NOTE — Telephone Encounter (Signed)
PATIENT WAS A NO SHOW AND LETTER SENT  °

## 2015-10-03 ENCOUNTER — Emergency Department (HOSPITAL_COMMUNITY)
Admission: EM | Admit: 2015-10-03 | Discharge: 2015-10-03 | Disposition: A | Discharge status: Home / Self Care | Payer: Self-pay | Attending: Emergency Medicine | Admitting: Emergency Medicine

## 2015-10-03 ENCOUNTER — Emergency Department (HOSPITAL_COMMUNITY): Payer: Self-pay

## 2015-10-03 ENCOUNTER — Encounter (HOSPITAL_COMMUNITY): Payer: Self-pay | Admitting: Emergency Medicine

## 2015-10-03 DIAGNOSIS — R200 Anesthesia of skin: Secondary | ICD-10-CM | POA: Insufficient documentation

## 2015-10-03 DIAGNOSIS — E785 Hyperlipidemia, unspecified: Secondary | ICD-10-CM | POA: Insufficient documentation

## 2015-10-03 DIAGNOSIS — R739 Hyperglycemia, unspecified: Secondary | ICD-10-CM

## 2015-10-03 DIAGNOSIS — I10 Essential (primary) hypertension: Secondary | ICD-10-CM | POA: Insufficient documentation

## 2015-10-03 DIAGNOSIS — E1165 Type 2 diabetes mellitus with hyperglycemia: Secondary | ICD-10-CM | POA: Insufficient documentation

## 2015-10-03 DIAGNOSIS — M79641 Pain in right hand: Secondary | ICD-10-CM | POA: Insufficient documentation

## 2015-10-03 DIAGNOSIS — Z7984 Long term (current) use of oral hypoglycemic drugs: Secondary | ICD-10-CM | POA: Insufficient documentation

## 2015-10-03 DIAGNOSIS — Z87891 Personal history of nicotine dependence: Secondary | ICD-10-CM | POA: Insufficient documentation

## 2015-10-03 LAB — BASIC METABOLIC PANEL
ANION GAP: 7 (ref 5–15)
BUN: 12 mg/dL (ref 6–20)
CHLORIDE: 100 mmol/L — AB (ref 101–111)
CO2: 26 mmol/L (ref 22–32)
Calcium: 8.7 mg/dL — ABNORMAL LOW (ref 8.9–10.3)
Creatinine, Ser: 0.74 mg/dL (ref 0.61–1.24)
GFR calc non Af Amer: >60 mL/min (ref >60)
GLUCOSE: 339 mg/dL — AB (ref 65–99)
POTASSIUM: 4 mmol/L (ref 3.5–5.1)
Sodium: 133 mmol/L — ABNORMAL LOW (ref 135–145)

## 2015-10-03 LAB — CBG MONITORING, ED: GLUCOSE-CAPILLARY: 340 mg/dL — AB (ref 65–99)

## 2015-10-03 MED ORDER — HYDROCODONE-ACETAMINOPHEN 5-325 MG PO TABS
1.0000 | ORAL_TABLET | Freq: Once | ORAL | Status: AC
  Administered 2015-10-03: 1 via ORAL
  Filled 2015-10-03: qty 1

## 2015-10-03 MED ORDER — METFORMIN HCL 1000 MG PO TABS: 1000.0000 mg | ORAL_TABLET | Freq: Two times a day (BID) | ORAL | 0 refills | Status: AC

## 2015-10-03 NOTE — Discharge Instructions (Signed)
It is possible you are having symptoms of carpal tunnel syndrome.  Wear the splint at night as discussed to help take the pressure off your wrist and tendons.  Watch your blood glucose levels as it is very high tonight.  You have been prescribed metformin to help cover you until you can follow up with your doctor.

## 2015-10-03 NOTE — ED Triage Notes (Signed)
Pt c/o rt hand pain when he flexes his wrist or makes a fist.

## 2015-10-06 NOTE — ED Provider Notes (Signed)
[facility] Provider Note   CSN: [id number] Arrival date & time: 10/03/15  4098  First Provider Contact:  First MD Initiated Contact with Patient 10/03/15 2039        History   Chief Complaint Chief Complaint  Patient presents with  . Hand Pain    HPI Jose Schar. is a 45 y.o. male.  Jose Kau. is a 45 y.o. Male with a history of DM and htn with DM not currently well controlled as he has run out of his Invokamet (unable to afford, has been getting samples from his pcp and will not see again until next week) presenting with right hand pain which worsens with certain movements, but also endorses an episode of weakness and numbness in the hand today around 4 pm, lasting less than 15 minutes but had difficulty holding a pen to complete paperwork at his job during this length of time.  He does endorse occasionally waking with numbness in his hand which he felt was due to sleep position as it resolves after moving around.  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  He denies headaches, neck or right upper arm pain or numbness, denies dizziness, other focal weakness, gait disturbance, chest pain or sob.       The history is provided by the patient.    Past Medical History:  Diagnosis Date  . Diabetes mellitus   . Hyperlipidemia   . Hypertension     Patient Active Problem List   Diagnosis Date Noted  . Nausea vomiting and diarrhea 08/03/2013  . Abnormal CT of the abdomen 08/03/2013  . DM type 2 (diabetes mellitus, type 2) (HCC) 08/03/2013  . HTN (hypertension), benign 08/03/2013  . Hyperlipidemia 08/03/2013  . JOINT EFFUSION, RIGHT KNEE 02/18/2008  . TEAR MEDIAL MENISCUS 02/18/2008    Past Surgical History:  Procedure Laterality Date  . HERNIA REPAIR         Home Medications    Prior to Admission medications   Medication Sig Start Date End Date Taking? Authorizing Provider  acetaminophen (TYLENOL) 500 MG tablet Take 1,000 mg  by mouth every 6 (six) hours as needed for headache.    Historical Provider, MD  aspirin EC 325 MG tablet Take 325 mg by mouth daily.    Historical Provider, MD  HYDROcodone-acetaminophen (NORCO/VICODIN) 5-325 MG tablet Take 1 tablet by mouth every 4 (four) hours as needed. Patient not taking: Reported on 01/11/2015 12/28/14   Janne Napoleon, NP  ibuprofen (ADVIL,MOTRIN) 200 MG tablet Take 200 mg by mouth every 6 (six) hours as needed for mild pain or moderate pain.    Historical Provider, MD  metFORMIN (GLUCOPHAGE) 1000 MG tablet Take 1 tablet (1,000 mg total) by mouth 2 (two) times daily. 10/03/15   Burgess Amor, PA-C  Multiple Vitamin tablet Take 1 tablet by mouth daily.    Historical Provider, MD  Potassium 99 MG TABS Take 1 tablet by mouth daily.    Historical Provider, MD    Family History Family History  Problem Relation Age of Onset  . Diabetes Mother   . Hypertension Mother   . Diabetes Father   . Hypertension Father   . Stroke Other     Social History Social History  Substance Use Topics  . Smoking status: Former Smoker    Years: 3.00    Types: Cigarettes    Quit date: 12/10/1988  . Smokeless tobacco: Current User    Types: Snuff  .  Alcohol use No     Allergies   Penicillins   Review of Systems Review of Systems  Constitutional: Negative for fever.  Musculoskeletal: Positive for arthralgias and joint swelling. Negative for myalgias.  Neurological: Positive for numbness. Negative for weakness.     Physical Exam Updated Vital Signs BP 149/91 (BP Location: Right Arm)   Pulse 66   Temp 98.3 F (36.8 C) (Oral)   Resp 18   Ht 6\' 2"  (1.88 m)   Wt 133.8 kg   SpO2 98%   BMI 37.88 kg/m   Physical Exam  Constitutional: He appears well-developed and well-nourished.  HENT:  Head: Atraumatic.  Neck: Normal range of motion.  Cardiovascular:  Pulses equal bilaterally  Musculoskeletal: He exhibits no tenderness.       Right hand: He exhibits no tenderness, normal  two-point discrimination, normal capillary refill, no deformity and no swelling. Normal sensation noted. Normal strength noted.  Right hand without reproducible pain with palpation.  Negative Phalen's, negative tinels test.  Neurological: He is alert. He has normal strength. He displays normal reflexes. No sensory deficit.  Skin: Skin is warm and dry.  Psychiatric: He has a normal mood and affect.     ED Treatments / Results  Labs (all labs ordered are listed, but only abnormal results are displayed) Labs Reviewed  BASIC METABOLIC PANEL - Abnormal; Notable for the following:       Result Value   Sodium 133 (*)    Chloride 100 (*)    Glucose, Bld 339 (*)    Calcium 8.7 (*)    All other components within normal limits  CBG MONITORING, ED - Abnormal; Notable for the following:    Glucose-Capillary 340 (*)    All other components within normal limits    EKG  EKG Interpretation None       Radiology  Final result by Marilynn Rail, MD (10/03/15 20:02:39)           Narrative:   CLINICAL DATA: Right wrist and hand pain. No known injury. EXAM: RIGHT WRIST - COMPLETE 3+ VIEW; RIGHT HAND - COMPLETE 3+ VIEW COMPARISON: None. FINDINGS: There is no evidence of fracture or dislocation. There is no evidence of arthropathy or other focal bone abnormality. Soft tissues are unremarkable. IMPRESSION: No acute fracture or dislocation identified about the right wrist or hand. Electronically Signed By: Ted Mcalpine M.D. On: 10/03/2015 20:02           Procedures Procedures (including critical care time)  Medications Ordered in ED Medications  HYDROcodone-acetaminophen (NORCO/VICODIN) 5-325 MG per tablet 1 tablet (1 tablet Oral Given 10/03/15 2122)     Initial Impression / Assessment and Plan / ED Course  I have reviewed the triage vital signs and the nursing notes.  Pertinent labs & imaging results that were available during my care of the  patient were reviewed by me and considered in my medical decision making (see chart for details).  Hyperglycemia without acidosis.  Pt endorses just ate large meal prior to arrival.  Out of his Invokamet x 3 days.  Clinical Course    Right hand/wrist pain with episodic numbness suggesting possible Carpel Tunnel syndrome.  No focal weakness on exam. He was placed in a velcro wrist splint for night time use. He was given prescription for metformin to bridge until can get more of his Invokamet by pcp (also states will have prescription coverage with new job within the month). Advised recheck by pcp as planned.  Close f/u  and cbg checks re glucose control.    Pt seen by Dr. Manus Gunning during this visit.  Final Clinical Impressions(s) / ED Diagnoses   Final diagnoses:  Right hand pain  Hyperglycemia    New Prescriptions Discharge Medication List as of 10/03/2015 11:01 PM    START taking these medications   Details  metFORMIN (GLUCOPHAGE) 1000 MG tablet Take 1 tablet (1,000 mg total) by mouth 2 (two) times daily., Starting Mon 10/03/2015, Print         Burgess Amor, PA-C 10/06/15 1402    Glynn Octave, MD 10/07/15 [phone]

## 2016-05-18 ENCOUNTER — Emergency Department (HOSPITAL_COMMUNITY)
Admission: EM | Admit: 2016-05-18 | Discharge: 2016-05-18 | Disposition: A | Discharge status: Home / Self Care | Payer: BLUE CROSS/BLUE SHIELD | Attending: Emergency Medicine | Admitting: Emergency Medicine

## 2016-05-18 ENCOUNTER — Encounter (HOSPITAL_COMMUNITY): Payer: Self-pay | Admitting: Emergency Medicine

## 2016-05-18 DIAGNOSIS — L03011 Cellulitis of right finger: Secondary | ICD-10-CM

## 2016-05-18 DIAGNOSIS — F17290 Nicotine dependence, other tobacco product, uncomplicated: Secondary | ICD-10-CM | POA: Insufficient documentation

## 2016-05-18 DIAGNOSIS — Z7982 Long term (current) use of aspirin: Secondary | ICD-10-CM | POA: Insufficient documentation

## 2016-05-18 DIAGNOSIS — Z791 Long term (current) use of non-steroidal anti-inflammatories (NSAID): Secondary | ICD-10-CM | POA: Insufficient documentation

## 2016-05-18 DIAGNOSIS — I10 Essential (primary) hypertension: Secondary | ICD-10-CM | POA: Insufficient documentation

## 2016-05-18 DIAGNOSIS — E119 Type 2 diabetes mellitus without complications: Secondary | ICD-10-CM | POA: Insufficient documentation

## 2016-05-18 DIAGNOSIS — Z7984 Long term (current) use of oral hypoglycemic drugs: Secondary | ICD-10-CM | POA: Insufficient documentation

## 2016-05-18 DIAGNOSIS — Z79899 Other long term (current) drug therapy: Secondary | ICD-10-CM | POA: Insufficient documentation

## 2016-05-18 MED ORDER — PENTAFLUOROPROP-TETRAFLUOROETH EX AERO
INHALATION_SPRAY | CUTANEOUS | Status: AC
  Filled 2016-05-18: qty 103.5

## 2016-05-18 MED ORDER — PENTAFLUOROPROP-TETRAFLUOROETH EX AERO
INHALATION_SPRAY | CUTANEOUS | Status: AC | PRN
  Administered 2016-05-18: 1 via TOPICAL

## 2016-05-18 MED ORDER — HYDROGEN PEROXIDE 3 % EX SOLN
CUTANEOUS | Status: AC
  Filled 2016-05-18: qty 473

## 2016-05-18 MED ORDER — SULFAMETHOXAZOLE-TRIMETHOPRIM 800-160 MG PO TABS: 1.0000 | ORAL_TABLET | Freq: Two times a day (BID) | ORAL | 0 refills | Status: AC

## 2016-05-18 NOTE — ED Triage Notes (Signed)
Pt reports right hand and little finger pain x2-3 days.  Pt has redness to finger.

## 2016-05-18 NOTE — ED Provider Notes (Signed)
[facility] Provider Note   CSN: [id number] Arrival date & time: 05/18/16  [id number]     History   Chief Complaint Chief Complaint  Patient presents with  . Hand Pain    HPI Jose PressmanWilliam F Risinger Jr. is a 46 y.o. male.  The history is provided by the patient. No language interpreter was used.  Hand Pain  This is a new problem. The current episode started more than 2 days ago. The problem occurs constantly. The problem has been gradually worsening. Nothing aggravates the symptoms. Nothing relieves the symptoms. He has tried a warm compress for the symptoms. The treatment provided no relief.  Pt complains of swelling to the end of this right little finger.  Pt thinks he may have an infection  Past Medical History:  Diagnosis Date  . Diabetes mellitus   . Hyperlipidemia   . Hypertension     Patient Active Problem List   Diagnosis Date Noted  . Nausea vomiting and diarrhea 08/03/2013  . Abnormal CT of the abdomen 08/03/2013  . DM type 2 (diabetes mellitus, type 2) (HCC) 08/03/2013  . HTN (hypertension), benign 08/03/2013  . Hyperlipidemia 08/03/2013  . JOINT EFFUSION, RIGHT KNEE 02/18/2008  . TEAR MEDIAL MENISCUS 02/18/2008    Past Surgical History:  Procedure Laterality Date  . HERNIA REPAIR         Home Medications    Prior to Admission medications   Medication Sig Start Date End Date Taking? Authorizing Provider  acetaminophen (TYLENOL) 500 MG tablet Take 1,000 mg by mouth every 6 (six) hours as needed for headache.    Historical Provider, MD  aspirin EC 325 MG tablet Take 325 mg by mouth daily.    Historical Provider, MD  HYDROcodone-acetaminophen (NORCO/VICODIN) 5-325 MG tablet Take 1 tablet by mouth every 4 (four) hours as needed. Patient not taking: Reported on 01/11/2015 12/28/14   Janne NapoleonHope M Neese, NP  ibuprofen (ADVIL,MOTRIN) 200 MG tablet Take 200 mg by mouth every 6 (six) hours as needed for mild pain or moderate pain.    Historical Provider, MD  metFORMIN  (GLUCOPHAGE) 1000 MG tablet Take 1 tablet (1,000 mg total) by mouth 2 (two) times daily. 10/03/15   Burgess AmorJulie Idol, PA-C  Multiple Vitamin tablet Take 1 tablet by mouth daily.    Historical Provider, MD  Potassium 99 MG TABS Take 1 tablet by mouth daily.    Historical Provider, MD  sulfamethoxazole-trimethoprim (BACTRIM DS,SEPTRA DS) 800-160 MG tablet Take 1 tablet by mouth 2 (two) times daily. 05/18/16 05/25/16  Elson AreasLeslie K Tere Mcconaughey, PA-C    Family History Family History  Problem Relation Age of Onset  . Diabetes Mother   . Hypertension Mother   . Diabetes Father   . Hypertension Father   . Stroke Other     Social History Social History  Substance Use Topics  . Smoking status: Former Smoker    Years: 3.00    Types: Cigarettes    Quit date: 12/10/1988  . Smokeless tobacco: Current User    Types: Snuff  . Alcohol use No     Allergies   Penicillins   Review of Systems Review of Systems  All other systems reviewed and are negative.    Physical Exam Updated Vital Signs BP 171/96   Pulse 67   Temp 97.9 F (36.6 C) (Oral)   Resp 16   Ht 6\' 2"  (1.88 m)   Wt 123.8 kg   SpO2 98%   BMI 35.05 kg/m   Physical Exam  Constitutional: He is oriented to person, place, and time. He appears well-developed and well-nourished.  HENT:  Head: Normocephalic.  Eyes: EOM are normal.  Neck: Normal range of motion.  Pulmonary/Chest: Effort normal.  Abdominal: He exhibits no distension.  Musculoskeletal: He exhibits tenderness.  Swollen distal finger,  Swelling around nail,  Obvious abscess  Neurological: He is alert and oriented to person, place, and time.  Psychiatric: He has a normal mood and affect.  Nursing note and vitals reviewed.    ED Treatments / Results  Labs (all labs ordered are listed, but only abnormal results are displayed) Labs Reviewed - No data to display  EKG  EKG Interpretation None       Radiology No results found.  Procedures .Marland KitchenIncision and  Drainage Date/Time: 05/18/2016 1:32 PM Performed by: Elson Areas Authorized by: Elson Areas   Consent:    Consent obtained:  Verbal Location:    Type:  Abscess   Size:  1   Location:  Upper extremity   Upper extremity location:  Finger   Finger location:  R small finger Pre-procedure details:    Skin preparation:  Betadine Anesthesia (see MAR for exact dosages):    Anesthesia method:  Topical application Procedure type:    Complexity:  Simple Procedure details:    Needle aspiration: yes     Incision types:  Stab incision   Drainage:  Purulent   Drainage amount:  Moderate   Wound treatment:  Wound left open Post-procedure details:    Patient tolerance of procedure:  Tolerated well, no immediate complications   (including critical care time)  Medications Ordered in ED Medications  pentafluoroprop-tetrafluoroeth (GEBAUERS) aerosol (1 application Topical Given by Other 05/18/16 0906)     Initial Impression / Assessment and Plan / ED Course  I have reviewed the triage vital signs and the nursing notes.  Pertinent labs & imaging results that were available during my care of the patient were reviewed by me and considered in my medical decision making (see chart for details).       Final Clinical Impressions(s) / ED Diagnoses   Final diagnoses:  Paronychia of finger, right    New Prescriptions Discharge Medication List as of 05/18/2016  9:20 AM    START taking these medications   Details  sulfamethoxazole-trimethoprim (BACTRIM DS,SEPTRA DS) 800-160 MG tablet Take 1 tablet by mouth 2 (two) times daily., Starting Fri 05/18/2016, Until Fri 05/25/2016, Print      An After Visit Summary was printed and given to the patient.   Lonia Skinner [place], PA-C 05/18/16 1333    Bethann Berkshire, MD 05/18/16 [phone]

## 2016-05-18 NOTE — Discharge Instructions (Signed)
Return if any problems.

## 2016-05-18 NOTE — ED Notes (Addendum)
Pt made aware to return if symptoms worsen or if any life threatening symptoms occur.  Jose MaskerKaren sofia okay with pt bp, pt told to take his medication for bp.  Pt reports he normally takes it at lunch. Pt verbalizes understanding of d/c instructions.

## 2016-05-18 NOTE — ED Notes (Signed)
Pt finger wrapped after soaking in water and peroxide.

## 2018-01-10 DIAGNOSIS — M25562 Pain in left knee: Secondary | ICD-10-CM | POA: Diagnosis not present

## 2018-01-10 DIAGNOSIS — Z1389 Encounter for screening for other disorder: Secondary | ICD-10-CM | POA: Diagnosis not present

## 2018-01-10 DIAGNOSIS — E782 Mixed hyperlipidemia: Secondary | ICD-10-CM | POA: Diagnosis not present

## 2018-01-10 DIAGNOSIS — Z0001 Encounter for general adult medical examination with abnormal findings: Secondary | ICD-10-CM | POA: Diagnosis not present

## 2018-01-13 DIAGNOSIS — Z1389 Encounter for screening for other disorder: Secondary | ICD-10-CM | POA: Diagnosis not present

## 2018-01-13 DIAGNOSIS — Z6831 Body mass index (BMI) 31.0-31.9, adult: Secondary | ICD-10-CM | POA: Diagnosis not present

## 2018-01-13 DIAGNOSIS — E6609 Other obesity due to excess calories: Secondary | ICD-10-CM | POA: Diagnosis not present

## 2018-02-14 ENCOUNTER — Encounter (INDEPENDENT_AMBULATORY_CARE_PROVIDER_SITE_OTHER): Payer: Self-pay | Admitting: Orthopaedic Surgery

## 2018-02-14 ENCOUNTER — Telehealth (INDEPENDENT_AMBULATORY_CARE_PROVIDER_SITE_OTHER): Payer: Self-pay | Admitting: *Deleted

## 2018-02-14 ENCOUNTER — Ambulatory Visit (INDEPENDENT_AMBULATORY_CARE_PROVIDER_SITE_OTHER): Payer: Self-pay

## 2018-02-14 ENCOUNTER — Ambulatory Visit (INDEPENDENT_AMBULATORY_CARE_PROVIDER_SITE_OTHER): Payer: 59 | Admitting: Orthopaedic Surgery

## 2018-02-14 VITALS — BP 166/99 | HR 77 | Ht 74.0 in | Wt 238.0 lb

## 2018-02-14 DIAGNOSIS — G8929 Other chronic pain: Secondary | ICD-10-CM | POA: Diagnosis not present

## 2018-02-14 DIAGNOSIS — M25562 Pain in left knee: Secondary | ICD-10-CM | POA: Diagnosis not present

## 2018-02-14 NOTE — Telephone Encounter (Signed)
Please apply for visco for left knee for Dr. Whitfield patient. Thank you. 

## 2018-02-14 NOTE — Progress Notes (Signed)
Office Visit Note   Patient: Jose PressmanWilliam F Baldinger Jr.           Date of Birth: [birth year]           MRN: [id number] Visit Date: 02/14/2018              Requested by: Assunta FoundGolding, John, MD [address] [place], [place] [id number] PCP: Assunta FoundGolding, John, MD   Assessment & Plan: Visit Diagnoses:  1. Chronic pain of left knee     Plan: Long discussion regarding present issue with left knee.  He has advanced and progressive osteoarthritis left knee involving all 3 compartments.  I do not think it be a candidate for a unit compartment procedure.  Definitive procedure would be total knee replacement.  He has had cortisone in the past without much relief.  I would like to try Visco supplementation and a spider brace.  He is thinking about changing jobs which would limit the time he is on his feet.  He thought spider brace was comfortable.  Follow-Up Instructions: No follow-ups on file.   Orders:  Orders Placed This Encounter  Procedures  . XR KNEE 3 VIEW LEFT   No orders of the defined types were placed in this encounter.     Procedures: No procedures performed   Clinical Data: No additional findings.   Subjective: Chief Complaint  Patient presents with  . Left Knee - Pain  . Knee Pain    injuried his in middle school, locking , giving away, popping,pain , has ortho in past was told he needs surgery , uses a brace tylenol , aleve; biofreeze   Please 47 years old relates a long history of problems referable to his left knee.  He has been seen by an orthopedist in the past and apparently told that at some point in the future he might need his knee replaced.  He has had x-rays and prior MRI scans.  I reviewed films of his left knee in 2015 where he has decrease in the medial joint space associated with peripheral osteophytes, subchondral sclerosis and slight increased varus.  He also had an MRI scan of his left knee in 2015.  The scan demonstrated cartilage abnormalities of the  patellofemoral compartment and medial compartment. He has been experiencing difficulty with medial compartment pain.  Some.  Occasional issue with bending stooping and squatting.  His job requires him to be on his feet for long periods during the day.  He does have some recurrent swelling.  He is tried over-the-counter medicines with some relief. In the past he has had cortisone that only lasted "3 days".  He has been wearing a brace from a local "drugstore" that does not fit.  He has also tried "horse liniment" HPI  Review of Systems  Cardiovascular: Positive for leg swelling.  Musculoskeletal: Positive for joint swelling.  Neurological: Positive for weakness.     Objective: Vital Signs: BP (!) 166/99   Pulse 77   Ht 6\' 2"  (1.88 m)   Wt 238 lb (108 kg)   BMI 30.56 kg/m   Physical Exam  Constitutional: He is oriented to person, place, and time. He appears well-developed and well-nourished.  HENT:  Mouth/Throat: Oropharynx is clear and moist.  Eyes: Pupils are equal, round, and reactive to light. EOM are normal.  Pulmonary/Chest: Effort normal.  Neurological: He is alert and oriented to person, place, and time.  Skin: Skin is warm and dry.  Psychiatric: He has a normal mood and  affect. His behavior is normal.    Ortho Exam awake alert and oriented x3.  Comfortable sitting.  Left knee with minimal effusion.  Increased varus.  Mild medial joint pain.  Some patellar crepitation.  No pain laterally.  Full extension and 120 degrees of flexion.  No instability.  No calf pain.  Neurologically intact.  Straight leg raise negative.  Painless range of motion left hip  Specialty Comments:  No specialty comments available.  Imaging: No results found.   PMFS History: Patient Active Problem List   Diagnosis Date Noted  . Nausea vomiting and diarrhea 08/03/2013  . Abnormal CT of the abdomen 08/03/2013  . DM type 2 (diabetes mellitus, type 2) (HCC) 08/03/2013  . HTN (hypertension),  benign 08/03/2013  . Hyperlipidemia 08/03/2013  . JOINT EFFUSION, RIGHT KNEE 02/18/2008  . TEAR MEDIAL MENISCUS 02/18/2008   Past Medical History:  Diagnosis Date  . Diabetes mellitus   . Hyperlipidemia   . Hypertension     Family History  Problem Relation Age of Onset  . Diabetes Mother   . Hypertension Mother   . Diabetes Father   . Hypertension Father   . Stroke Other     Past Surgical History:  Procedure Laterality Date  . HERNIA REPAIR     Social History   Occupational History  . Not on file  Tobacco Use  . Smoking status: Former Smoker    Years: 3.00    Types: Cigarettes    Last attempt to quit: 12/10/1988    Years since quitting: 29.2  . Smokeless tobacco: Current User    Types: Snuff  Substance and Sexual Activity  . Alcohol use: No  . Drug use: No  . Sexual activity: Not on file

## 2018-02-17 ENCOUNTER — Telehealth (INDEPENDENT_AMBULATORY_CARE_PROVIDER_SITE_OTHER): Payer: Self-pay | Admitting: Orthopaedic Surgery

## 2018-02-17 NOTE — Telephone Encounter (Signed)
Patient called stating he had questions regarding his left knee immobilizer.  Patient requested a return call.

## 2018-02-17 NOTE — Telephone Encounter (Signed)
Patient calling regarding cost of brace and approval for Visco. Too soon to know. Patient needs to wait at least 30 days from last appointment for insurance.

## 2018-02-20 NOTE — Telephone Encounter (Signed)
Noted  

## 2018-03-13 ENCOUNTER — Telehealth (INDEPENDENT_AMBULATORY_CARE_PROVIDER_SITE_OTHER): Payer: Self-pay | Admitting: Orthopaedic Surgery

## 2018-03-13 ENCOUNTER — Telehealth (INDEPENDENT_AMBULATORY_CARE_PROVIDER_SITE_OTHER): Payer: Self-pay

## 2018-03-13 MED ORDER — TRAMADOL HCL 50 MG PO TABS: ORAL_TABLET | ORAL | 0 refills | Status: DC

## 2018-03-13 NOTE — Telephone Encounter (Signed)
Prescription has been faxed to the pharmacy, patient has been notified.  

## 2018-03-13 NOTE — Telephone Encounter (Signed)
Patient called asking if Dr. Cleophas Dunker could send in a prescription for pain medicine until his Euflexxa gel injections are approved.  [facility] [place].

## 2018-03-13 NOTE — Telephone Encounter (Signed)
Submitted VOB for Euflexxa, left knee. 

## 2018-03-13 NOTE — Telephone Encounter (Signed)
Tramadol 50 mg #30 1-2 tabs po bid prn

## 2018-03-13 NOTE — Telephone Encounter (Signed)
Patient last seen on 02/14/2018, knee immobilizer and visco was recommended.  Applied for left knee visco, we do not have a response yet.  Please advise.

## 2018-03-18 ENCOUNTER — Telehealth (INDEPENDENT_AMBULATORY_CARE_PROVIDER_SITE_OTHER): Payer: Self-pay

## 2018-03-18 NOTE — Telephone Encounter (Signed)
Please schedule patient an appointment with Dr. Durward Fortes for gel injection.  Thank you.  Patient is approved for Euflexxa series, left knee. Buy & Bill Covered at 80% of allowable amount after deductible has been met. Patient will be responsible for 20% OOP. No Co-pay No PA required

## 2018-03-19 NOTE — Telephone Encounter (Signed)
LMOM for patient to call and schedule Euflexxa injections.   °

## 2018-04-08 NOTE — Telephone Encounter (Signed)
LMOM for patient to call and schedule Euflexxa injections.   °

## 2018-04-18 DIAGNOSIS — Z1389 Encounter for screening for other disorder: Secondary | ICD-10-CM | POA: Diagnosis not present

## 2018-04-18 DIAGNOSIS — E119 Type 2 diabetes mellitus without complications: Secondary | ICD-10-CM | POA: Diagnosis not present

## 2018-04-18 DIAGNOSIS — Z6835 Body mass index (BMI) 35.0-35.9, adult: Secondary | ICD-10-CM | POA: Diagnosis not present

## 2018-04-18 DIAGNOSIS — E7849 Other hyperlipidemia: Secondary | ICD-10-CM | POA: Diagnosis not present

## 2018-04-18 DIAGNOSIS — I10 Essential (primary) hypertension: Secondary | ICD-10-CM | POA: Diagnosis not present

## 2018-04-18 DIAGNOSIS — E6609 Other obesity due to excess calories: Secondary | ICD-10-CM | POA: Diagnosis not present

## 2018-05-08 ENCOUNTER — Ambulatory Visit (INDEPENDENT_AMBULATORY_CARE_PROVIDER_SITE_OTHER): Payer: 59 | Admitting: Orthopaedic Surgery

## 2018-05-09 DIAGNOSIS — E119 Type 2 diabetes mellitus without complications: Secondary | ICD-10-CM | POA: Diagnosis not present

## 2018-05-15 ENCOUNTER — Ambulatory Visit (INDEPENDENT_AMBULATORY_CARE_PROVIDER_SITE_OTHER): Payer: 59 | Admitting: Orthopaedic Surgery

## 2018-05-22 ENCOUNTER — Encounter (INDEPENDENT_AMBULATORY_CARE_PROVIDER_SITE_OTHER): Payer: Self-pay | Admitting: Orthopaedic Surgery

## 2018-05-22 ENCOUNTER — Other Ambulatory Visit: Payer: Self-pay

## 2018-05-22 ENCOUNTER — Ambulatory Visit (INDEPENDENT_AMBULATORY_CARE_PROVIDER_SITE_OTHER): Payer: 59 | Admitting: Orthopaedic Surgery

## 2018-05-22 ENCOUNTER — Encounter (INDEPENDENT_AMBULATORY_CARE_PROVIDER_SITE_OTHER): Payer: Self-pay

## 2018-05-22 VITALS — BP 145/94 | HR 86 | Ht 74.0 in | Wt 260.0 lb

## 2018-05-22 DIAGNOSIS — M1712 Unilateral primary osteoarthritis, left knee: Secondary | ICD-10-CM | POA: Diagnosis not present

## 2018-05-22 MED ORDER — SODIUM HYALURONATE (VISCOSUP) 20 MG/2ML IX SOSY
20.0000 mg | PREFILLED_SYRINGE | INTRA_ARTICULAR | Status: AC | PRN
  Administered 2018-05-22: 20 mg via INTRA_ARTICULAR

## 2018-05-22 NOTE — Progress Notes (Signed)
   Office Visit Note   Patient: Jose Short.           Date of Birth: Aug 01, 1970           MRN: [id number] Visit Date: 05/22/2018              Requested by: Assunta Found, MD [address] [place], [place] [address] PCP: Assunta Found, MD   Assessment & Plan: Visit Diagnoses:  1. Unilateral primary osteoarthritis, left knee     Plan: First Euflexxa injection left knee.  Follow-up weekly for the next 2 weeks to complete the series Follow-Up Instructions: Return in about 1 week (around 05/29/2018).   Orders:  Orders Placed This Encounter  Procedures  . Large Joint Inj: L knee   No orders of the defined types were placed in this encounter.     Procedures: Large Joint Inj: L knee on 05/22/2018 4:13 PM Indications: pain and joint swelling Details: 25 G 1.5 in needle, anteromedial approach  Arthrogram: No  Medications: 20 mg Sodium Hyaluronate 20 MG/2ML Outcome: tolerated well, no immediate complications Procedure, treatment alternatives, risks and benefits explained, specific risks discussed. Consent was given by the patient. Immediately prior to procedure a time out was called to verify the correct patient, procedure, equipment, support staff and site/side marked as required. Patient was prepped and draped in the usual sterile fashion.       Clinical Data: No additional findings.   Subjective: Chief Complaint  Patient presents with  . Left Knee - Follow-up    Euflexxa started 05/22/2018  Patient presents today for the first injection of the Euflexxa injections for his left knee.   HPI  Review of Systems   Objective: Vital Signs: BP (!) 145/94   Pulse 86   Ht 6\' 2"  (1.88 m)   Wt 260 lb (117.9 kg)   BMI 33.38 kg/m   Physical Exam  Ortho Exam left knee was not hot red warm or swollen.  No significant medial lateral joint pain  Specialty Comments:  No specialty comments available.  Imaging: No results found.   PMFS History: Patient Active  Problem List   Diagnosis Date Noted  . Nausea vomiting and diarrhea 08/03/2013  . Abnormal CT of the abdomen 08/03/2013  . DM type 2 (diabetes mellitus, type 2) (HCC) 08/03/2013  . HTN (hypertension), benign 08/03/2013  . Hyperlipidemia 08/03/2013  . JOINT EFFUSION, RIGHT KNEE 02/18/2008  . TEAR MEDIAL MENISCUS 02/18/2008   Past Medical History:  Diagnosis Date  . Diabetes mellitus   . Hyperlipidemia   . Hypertension     Family History  Problem Relation Age of Onset  . Diabetes Mother   . Hypertension Mother   . Diabetes Father   . Hypertension Father   . Stroke Other     Past Surgical History:  Procedure Laterality Date  . HERNIA REPAIR     Social History   Occupational History  . Not on file  Tobacco Use  . Smoking status: Former Smoker    Years: 3.00    Types: Cigarettes    Last attempt to quit: 12/10/1988    Years since quitting: 29.4  . Smokeless tobacco: Current User    Types: Snuff  Substance and Sexual Activity  . Alcohol use: No  . Drug use: No  . Sexual activity: Not on file

## 2018-05-29 ENCOUNTER — Other Ambulatory Visit: Payer: Self-pay

## 2018-05-29 ENCOUNTER — Encounter (INDEPENDENT_AMBULATORY_CARE_PROVIDER_SITE_OTHER): Payer: Self-pay | Admitting: Orthopaedic Surgery

## 2018-05-29 ENCOUNTER — Ambulatory Visit (INDEPENDENT_AMBULATORY_CARE_PROVIDER_SITE_OTHER): Payer: 59 | Admitting: Orthopaedic Surgery

## 2018-05-29 VITALS — BP 173/72 | HR 80 | Ht 74.0 in | Wt 260.0 lb

## 2018-05-29 DIAGNOSIS — M1712 Unilateral primary osteoarthritis, left knee: Secondary | ICD-10-CM | POA: Insufficient documentation

## 2018-05-29 MED ORDER — SODIUM HYALURONATE (VISCOSUP) 20 MG/2ML IX SOSY
20.0000 mg | PREFILLED_SYRINGE | INTRA_ARTICULAR | Status: AC | PRN
  Administered 2018-05-29: 20 mg via INTRA_ARTICULAR

## 2018-05-29 NOTE — Progress Notes (Signed)
   Office Visit Note   Patient: Jose Short.           Date of Birth: May 01, 1970           MRN: [id number] Visit Date: 05/29/2018              Requested by: Assunta Found, MD [address] [place], [place] [address] PCP: Assunta Found, MD   Assessment & Plan: Visit Diagnoses:  1. Unilateral primary osteoarthritis, left knee     Plan: Second Euflexxa injection left knee.  Return next week for the third and final injection Follow-Up Instructions: Return in about 1 week (around 06/05/2018).   Orders:  Orders Placed This Encounter  Procedures  . Large Joint Inj: L knee   No orders of the defined types were placed in this encounter.     Procedures: Large Joint Inj: L knee on 05/29/2018 3:39 PM Indications: pain and diagnostic evaluation Details: 25 G 1.5 in needle, anteromedial approach  Arthrogram: No  Medications: 20 mg Sodium Hyaluronate 20 MG/2ML Procedure, treatment alternatives, risks and benefits explained, specific risks discussed. Consent was given by the patient. Patient was prepped and draped in the usual sterile fashion.       Clinical Data: No additional findings.   Subjective: Chief Complaint  Patient presents with  . Left Knee - Follow-up  Patient presents today for the second Euflexxa injection in his left knee. Patient states that he has noticed slight improvement.  No related fever chills or increased pain in his left knee  HPI  Review of Systems   Objective: Vital Signs: BP (!) 173/72   Pulse 80   Ht 6\' 2"  (1.88 m)   Wt 260 lb (117.9 kg)   BMI 33.38 kg/m   Physical Exam  Ortho Exam left knee was not hot red warm or swollen.  Minimal discomfort medially  Specialty Comments:  No specialty comments available.  Imaging: No results found.   PMFS History: Patient Active Problem List   Diagnosis Date Noted  . Unilateral primary osteoarthritis, left knee 05/29/2018  . Nausea vomiting and diarrhea 08/03/2013  . Abnormal CT  of the abdomen 08/03/2013  . DM type 2 (diabetes mellitus, type 2) (HCC) 08/03/2013  . HTN (hypertension), benign 08/03/2013  . Hyperlipidemia 08/03/2013  . JOINT EFFUSION, RIGHT KNEE 02/18/2008  . TEAR MEDIAL MENISCUS 02/18/2008   Past Medical History:  Diagnosis Date  . Diabetes mellitus   . Hyperlipidemia   . Hypertension     Family History  Problem Relation Age of Onset  . Diabetes Mother   . Hypertension Mother   . Diabetes Father   . Hypertension Father   . Stroke Other     Past Surgical History:  Procedure Laterality Date  . HERNIA REPAIR     Social History   Occupational History  . Not on file  Tobacco Use  . Smoking status: Former Smoker    Years: 3.00    Types: Cigarettes    Last attempt to quit: 12/10/1988    Years since quitting: 29.4  . Smokeless tobacco: Current User    Types: Snuff  Substance and Sexual Activity  . Alcohol use: No  . Drug use: No  . Sexual activity: Not on file

## 2018-06-05 ENCOUNTER — Encounter (INDEPENDENT_AMBULATORY_CARE_PROVIDER_SITE_OTHER): Payer: Self-pay | Admitting: Orthopaedic Surgery

## 2018-06-05 ENCOUNTER — Other Ambulatory Visit: Payer: Self-pay

## 2018-06-05 ENCOUNTER — Ambulatory Visit (INDEPENDENT_AMBULATORY_CARE_PROVIDER_SITE_OTHER): Payer: 59 | Admitting: Orthopaedic Surgery

## 2018-06-05 DIAGNOSIS — M1712 Unilateral primary osteoarthritis, left knee: Secondary | ICD-10-CM | POA: Diagnosis not present

## 2018-06-05 MED ORDER — SODIUM HYALURONATE (VISCOSUP) 20 MG/2ML IX SOSY
20.0000 mg | PREFILLED_SYRINGE | INTRA_ARTICULAR | Status: AC | PRN
  Administered 2018-06-05: 20 mg via INTRA_ARTICULAR

## 2018-06-05 MED ORDER — LIDOCAINE HCL 1 % IJ SOLN
2.0000 mL | INTRAMUSCULAR | Status: AC | PRN
  Administered 2018-06-05: 2 mL

## 2018-06-05 NOTE — Progress Notes (Signed)
   Office Visit Note   Patient: Jose Short.           Date of Birth: 05/24/1970           MRN: [id number] Visit Date: 06/05/2018              Requested by: Assunta Found, MD [address] [place], [place] [address] PCP: Assunta Found, MD   Assessment & Plan: Visit Diagnoses:  1. Unilateral primary osteoarthritis, left knee     Plan: Third Euflexxa injection left knee return as needed  Follow-Up Instructions: Return if symptoms worsen or fail to improve.   Orders:  Orders Placed This Encounter  Procedures  . Large Joint Inj: L knee   No orders of the defined types were placed in this encounter.     Procedures: Large Joint Inj: L knee on 06/05/2018 8:05 AM Indications: pain and joint swelling Details: 25 G 1.5 in needle, anteromedial approach  Arthrogram: No  Medications: 2 mL lidocaine 1 %; 20 mg Sodium Hyaluronate 20 MG/2ML Outcome: tolerated well, no immediate complications Procedure, treatment alternatives, risks and benefits explained, specific risks discussed. Consent was given by the patient. Immediately prior to procedure a time out was called to verify the correct patient, procedure, equipment, support staff and site/side marked as required. Patient was prepped and draped in the usual sterile fashion.       Clinical Data: No additional findings.   Subjective: Chief Complaint  Patient presents with  . Left Knee - Follow-up  Patient presents today for left knee Euflexxa injections. This is the third injection. He has noticed some improvement during the day, but still hurts in the evening.  HPI  Review of Systems   Objective: Vital Signs: There were no vitals taken for this visit.  Physical Exam  Ortho Exam left knee was not hot red warm or swollen.  No effusion.  Minimal medial joint pain  Specialty Comments:  No specialty comments available.  Imaging: No results found.   PMFS History: Patient Active Problem List   Diagnosis  Date Noted  . Unilateral primary osteoarthritis, left knee 05/29/2018  . Nausea vomiting and diarrhea 08/03/2013  . Abnormal CT of the abdomen 08/03/2013  . DM type 2 (diabetes mellitus, type 2) (HCC) 08/03/2013  . HTN (hypertension), benign 08/03/2013  . Hyperlipidemia 08/03/2013  . JOINT EFFUSION, RIGHT KNEE 02/18/2008  . TEAR MEDIAL MENISCUS 02/18/2008   Past Medical History:  Diagnosis Date  . Diabetes mellitus   . Hyperlipidemia   . Hypertension     Family History  Problem Relation Age of Onset  . Diabetes Mother   . Hypertension Mother   . Diabetes Father   . Hypertension Father   . Stroke Other     Past Surgical History:  Procedure Laterality Date  . HERNIA REPAIR     Social History   Occupational History  . Not on file  Tobacco Use  . Smoking status: Former Smoker    Years: 3.00    Types: Cigarettes    Last attempt to quit: 12/10/1988    Years since quitting: 29.5  . Smokeless tobacco: Current User    Types: Snuff  Substance and Sexual Activity  . Alcohol use: No  . Drug use: No  . Sexual activity: Not on file

## 2018-11-05 ENCOUNTER — Encounter: Payer: Self-pay | Admitting: *Deleted

## 2018-12-08 ENCOUNTER — Other Ambulatory Visit: Payer: Self-pay

## 2018-12-08 ENCOUNTER — Ambulatory Visit (INDEPENDENT_AMBULATORY_CARE_PROVIDER_SITE_OTHER): Payer: Self-pay | Admitting: *Deleted

## 2018-12-08 DIAGNOSIS — Z1211 Encounter for screening for malignant neoplasm of colon: Secondary | ICD-10-CM

## 2018-12-08 NOTE — Progress Notes (Signed)
Pt requested a procedure on a Monday in Jan.  Informed pt that we will contact him once the Jan schedules are released.

## 2018-12-08 NOTE — Progress Notes (Addendum)
Gastroenterology Pre-Procedure Review  Request Date: 12/08/2018 Requesting Physician: Delman Cheadle, PA-C @ [facility], no previous TCS  PATIENT REVIEW QUESTIONS: The patient responded to the following health history questions as indicated:    1. Diabetes Melitis: yes 2. Joint replacements in the past 12 months: no 3. Major health problems in the past 3 months: no 4. Has an artificial valve or MVP: no 5. Has a defibrillator: no 6. Has been advised in past to take antibiotics in advance of a procedure like teeth cleaning: no 7. Family history of colon cancer: no 8. Alcohol Use: no 9. Illicit drug Use: no 10. History of sleep apnea: no 11. History of coronary artery or other vascular stents placed within the last 12 months: no 12. History of any prior anesthesia complications: yes, 1779 needed oxygen or breathing treatment after procedure 13. There is no height or weight on file to calculate BMI. ht: 6'2 wt: 275 lbs    MEDICATIONS & ALLERGIES:    Patient reports the following regarding taking any blood thinners:   Plavix? no Aspirin? yes Coumadin? no Brilinta? no Xarelto? no Eliquis? no Pradaxa? no Savaysa? no Effient? no  Patient confirms/reports the following medications:  Current Outpatient Medications  Medication Sig Dispense Refill  . aspirin EC 325 MG tablet Take 325 mg by mouth daily.    Marland Kitchen atorvastatin (LIPITOR) 20 MG tablet Take 20 mg by mouth daily.    Marland Kitchen glipiZIDE (GLUCOTROL XL) 10 MG 24 hr tablet Take 20 mg by mouth daily with breakfast.     . lisinopril (PRINIVIL,ZESTRIL) 10 MG tablet Take 10 mg by mouth daily.    . metFORMIN (GLUCOPHAGE) 1000 MG tablet Take 1 tablet (1,000 mg total) by mouth 2 (two) times daily. 30 tablet 0  . Multiple Vitamin tablet Take 1 tablet by mouth daily.    . Potassium 99 MG TABS Take 1 tablet by mouth daily.    Marland Kitchen acetaminophen (TYLENOL) 500 MG tablet Take 1,000 mg by mouth as needed for headache.     . ibuprofen (ADVIL,MOTRIN) 200 MG  tablet Take 200 mg by mouth as needed for mild pain or moderate pain.      No current facility-administered medications for this visit.     Patient confirms/reports the following allergies:  Allergies  Allergen Reactions  . Penicillins Rash    No orders of the defined types were placed in this encounter.   AUTHORIZATION INFORMATION Primary Insurance: [facility],  [place] #: [id number] Pre-Cert / Josem Kaufmann required: No, not required per Alta Corning / Auth #: Ref# [id number]  SCHEDULE INFORMATION: Procedure has been scheduled as follows:  Date: 03/23/2019 , Time: 12:00 Location: [facility] with Dr. Oneida Alar  This Gastroenterology Pre-Precedure Review Form is being routed to the following provider(s): Roseanne Kaufman, NP

## 2018-12-12 NOTE — Progress Notes (Signed)
Noted. Will triage at that time. May need office visit.

## 2018-12-17 NOTE — Progress Notes (Signed)
Jan procedure schedules have been released.  Will pt need an ov?

## 2018-12-18 ENCOUNTER — Encounter: Payer: Self-pay | Admitting: *Deleted

## 2018-12-18 MED ORDER — NA SULFATE-K SULFATE-MG SULF 17.5-3.13-1.6 GM/177ML PO SOLN: 1.0000 | Freq: Once | ORAL | 0 refills | Status: AC

## 2018-12-18 NOTE — Addendum Note (Signed)
Addended by: Metro Kung on: 12/18/2018 12:44 PM   Modules accepted: Orders, SmartSet

## 2018-12-18 NOTE — Progress Notes (Signed)
Appropriate. No oral diabetes medication day of procedure.

## 2018-12-18 NOTE — Addendum Note (Signed)
Addended by: Metro Kung on: 12/18/2018 12:10 PM   Modules accepted: Orders

## 2018-12-18 NOTE — Progress Notes (Addendum)
Spoke with pt and informed him that Jan procedure schedules were now available.  Pt scheduled procedure for 03/23/2019.  Pt is aware that I will mail him diabetes medication adjustments, prep instructions, and Covid screening info.  Routing to Vicente Males since she approved original triage.

## 2018-12-18 NOTE — Patient Instructions (Addendum)
Jose Short  [birth year] MRN: [id number]     Procedure Date: 07/06/2019 Time to register: 7:30 am Place to register: [facility] Procedure Time: 8:30 am Scheduled provider: Dr. Oneida Alar    PREPARATION FOR COLONOSCOPY WITH SUPREP BOWEL PREP KIT  Note: Suprep Bowel Prep Kit is a split-dose (2day) regimen. Consumption of BOTH 6-ounce bottles is required for a complete prep.  Please notify us immediately if you are diabetic, take iron supplements, or if you are on Coumadin or any other blood thinners.  Please hold the following medications: See letter                                                                                                                                                1 DAY BEFORE PROCEDURE:  DATE: 07/05/2019   DAY: Sunday Continue clear liquids the entire day - NO SOLID FOOD.   Diabetic medications adjustments for today: See letter  At 6:00pm: Complete steps 1 through 4 below, using ONE (1) 6-ounce bottle, before going to bed. Step 1:  Pour ONE (1) 6-ounce bottle of SUPREP liquid into the mixing container.  Step 2:  Add cool drinking water to the 16 ounce line on the container and mix.  Note: Dilute the solution concentrate as directed prior to use. Step 3:  DRINK ALL the liquid in the container. Step 4:  You MUST drink an additional two (2) or more 16 ounce containers of water over the next one (1) hour.   Continue clear liquids.  DAY OF PROCEDURE:   DATE: 07/06/2019   DAY: Monday If you take medications for your heart, blood pressure, or breathing, you may take these medications.  Diabetic medications adjustments for today: See letter  5 hours before your procedure at 3:30 am: Step 1:  Pour ONE (1) 6-ounce bottle of SUPREP liquid into the mixing container.  Step 2:  Add cool drinking water to the 16 ounce line on the container and mix.  Note: Dilute the solution concentrate as directed prior to use. Step 3:  DRINK ALL the liquid in the  container. Step 4:  You MUST drink an additional two (2) or more 16 ounce containers of water over the next one (1) hour. You MUST complete the final glass of water at least 3 hours before your colonoscopy. Nothing by mouth past 5:30 am  You may take your morning medications with sip of water unless we have instructed otherwise.    Please see below for Dietary Information.  CLEAR LIQUIDS INCLUDE:  Water Jello (NOT red in color)   Ice Popsicles (NOT red in color)   Tea (sugar ok, no milk/cream) Powdered fruit flavored drinks  Coffee (sugar ok, no milk/cream) Gatorade/ Lemonade/ Kool-Aid  (NOT red in color)   Juice: apple, white grape, white cranberry Soft drinks  Clear bullion, consomme, broth (  fat free beef/chicken/vegetable)  Carbonated beverages (any kind)  Strained chicken noodle soup Hard Candy   Remember: Clear liquids are liquids that will allow you to see your fingers on the other side of a clear glass. Be sure liquids are NOT red in color, and not cloudy, but CLEAR.  DO NOT EAT OR DRINK ANY OF THE FOLLOWING:  Dairy products of any kind   Cranberry juice Tomato juice / V8 juice   Grapefruit juice Orange juice     Red grape juice  Do not eat any solid foods, including such foods as: cereal, oatmeal, yogurt, fruits, vegetables, creamed soups, eggs, bread, crackers, pureed foods in a blender, etc.   HELPFUL HINTS FOR DRINKING PREP SOLUTION:   Make sure prep is extremely cold. Mix and refrigerate the the morning of the prep. You may also put in the freezer.   You may try mixing some Crystal Light or Country Time Lemonade if you prefer. Mix in small amounts; add more if necessary.  Try drinking through a straw  Rinse mouth with water or a mouthwash between glasses, to remove after-taste.  Try sipping on a cold beverage /ice/ popsicles between glasses of prep.  Place a piece of sugar-free hard candy in mouth between glasses.  If you become nauseated, try consuming smaller  amounts, or stretch out the time between glasses. Stop for 30-60 minutes, then slowly start back drinking.     OTHER INSTRUCTIONS  You will need a responsible adult at least 50 years of age to accompany you and drive you home. This person must remain in the waiting room during your procedure. The hospital will cancel your procedure if you do not have a responsible adult with you.   1. Wear loose fitting clothing that is easily removed. 2. Leave jewelry and other valuables at home.  3. Remove all body piercing jewelry and leave at home. 4. Total time from sign-in until discharge is approximately 2-3 hours. 5. You should go home directly after your procedure and rest. You can resume normal activities the day after your procedure. 6. The day of your procedure you should not:  Drive  Make legal decisions  Operate machinery  Drink alcohol  Return to work   You may call the office (Dept: [phone]) before 5:00pm, or page the doctor on call [phone]) after 5:00pm, for further instructions, if necessary.   Insurance Information YOU WILL NEED TO CHECK WITH YOUR INSURANCE COMPANY FOR THE BENEFITS OF COVERAGE YOU HAVE FOR THIS PROCEDURE.  UNFORTUNATELY, NOT ALL INSURANCE COMPANIES HAVE BENEFITS TO COVER ALL OR PART OF THESE TYPES OF PROCEDURES.  IT IS YOUR RESPONSIBILITY TO CHECK YOUR BENEFITS, HOWEVER, WE WILL BE GLAD TO ASSIST YOU WITH ANY CODES YOUR INSURANCE COMPANY MAY NEED.    PLEASE NOTE THAT MOST INSURANCE COMPANIES WILL NOT COVER A SCREENING COLONOSCOPY FOR PEOPLE UNDER THE AGE OF 50  IF YOU HAVE BCBS INSURANCE, YOU MAY HAVE BENEFITS FOR A SCREENING COLONOSCOPY BUT IF POLYPS ARE FOUND THE DIAGNOSIS WILL CHANGE AND THEN YOU MAY HAVE A DEDUCTIBLE THAT WILL NEED TO BE MET. SO PLEASE MAKE SURE YOU CHECK YOUR BENEFITS FOR A SCREENING COLONOSCOPY AS WELL AS A DIAGNOSTIC COLONOSCOPY.

## 2019-03-17 ENCOUNTER — Telehealth: Payer: Self-pay | Admitting: Gastroenterology

## 2019-03-17 ENCOUNTER — Encounter: Payer: Self-pay | Admitting: *Deleted

## 2019-03-17 NOTE — Telephone Encounter (Signed)
Pt is starting a new job and needs to cancel his procedure with SF on 03/23/2019

## 2019-03-17 NOTE — Telephone Encounter (Addendum)
Called pt back and he rescheduled his Covid screening to 07/03/2019 and procedure to 07/06/2019.  Pt is aware that I will mail him out new prep instructions, Covid screening information, and diabetes medication adjustments.  Lvmom to notify Endo.  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

## 2019-03-20 ENCOUNTER — Other Ambulatory Visit (HOSPITAL_COMMUNITY): Payer: Self-pay

## 2019-03-27 ENCOUNTER — Ambulatory Visit: Payer: 59 | Attending: Internal Medicine

## 2019-03-27 ENCOUNTER — Other Ambulatory Visit: Payer: Self-pay

## 2019-03-27 DIAGNOSIS — Z20822 Contact with and (suspected) exposure to covid-19: Secondary | ICD-10-CM

## 2019-03-29 LAB — NOVEL CORONAVIRUS, NAA: SARS-CoV-2, NAA: DETECTED — AB

## 2019-03-30 ENCOUNTER — Telehealth: Payer: Self-pay | Admitting: Internal Medicine

## 2019-03-30 ENCOUNTER — Other Ambulatory Visit: Payer: 59

## 2019-03-30 NOTE — Telephone Encounter (Signed)
Followed-up from phone call earlier this am. Pt is candidate for Monoclonal antibody infusion, bamlanivimab based on hx of DM. Description of tx given in earlier phone call. Left message for pt to call hotline at [phone] should he wish to proceed with infusion. Infusion would need to be scheduled on or before 04/05/19.   Cordelia Pen, NP-C [facility]  pgr [phone]

## 2019-03-30 NOTE — Telephone Encounter (Signed)
Called to discuss with Jose Short. about Covid symptoms and the use of bamlanivimab, a monoclonal antibody infusion for those with mild to moderate Covid symptoms and at a high risk of hospitalization.     Pt is qualified for this infusion at the [facility] infusion center due to co-morbid conditions and/or a member of an at-risk group, however would like time to consider infusion. Symptoms tier reviewed as well as criteria for ending isolation.  Symptoms reviewed that would warrant ED/Hospital evaluation. Preventative practices reviewed. Patient verbalized understanding. Patient advised that I will call back to decide if he does want to get infusion. Last date he would be eligible for infusion is 04/06/19.     Patient Active Problem List   Diagnosis Date Noted  . Unilateral primary osteoarthritis, left knee 05/29/2018  . Nausea vomiting and diarrhea 08/03/2013  . Abnormal CT of the abdomen 08/03/2013  . DM type 2 (diabetes mellitus, type 2) (HCC) 08/03/2013  . HTN (hypertension), benign 08/03/2013  . Hyperlipidemia 08/03/2013  . JOINT EFFUSION, RIGHT KNEE 02/18/2008  . TEAR MEDIAL MENISCUS 02/18/2008     Cordelia Pen, NP-C [facility]  Cell [phone]

## 2019-04-08 ENCOUNTER — Other Ambulatory Visit: Payer: Self-pay

## 2019-04-08 ENCOUNTER — Ambulatory Visit: Payer: 59 | Attending: Internal Medicine

## 2019-04-08 DIAGNOSIS — Z20822 Contact with and (suspected) exposure to covid-19: Secondary | ICD-10-CM

## 2019-04-09 LAB — NOVEL CORONAVIRUS, NAA: SARS-CoV-2, NAA: NOT DETECTED

## 2019-06-24 ENCOUNTER — Encounter: Payer: Self-pay | Admitting: Orthopaedic Surgery

## 2019-06-24 ENCOUNTER — Other Ambulatory Visit: Payer: Self-pay

## 2019-06-24 ENCOUNTER — Ambulatory Visit (INDEPENDENT_AMBULATORY_CARE_PROVIDER_SITE_OTHER): Payer: Self-pay

## 2019-06-24 ENCOUNTER — Ambulatory Visit: Payer: Self-pay | Admitting: Orthopaedic Surgery

## 2019-06-24 VITALS — Ht 71.5 in | Wt 251.0 lb

## 2019-06-24 DIAGNOSIS — M1712 Unilateral primary osteoarthritis, left knee: Secondary | ICD-10-CM

## 2019-06-24 DIAGNOSIS — G8929 Other chronic pain: Secondary | ICD-10-CM

## 2019-06-24 DIAGNOSIS — M25562 Pain in left knee: Secondary | ICD-10-CM

## 2019-06-24 NOTE — Progress Notes (Signed)
Office Visit Note   Patient: Jose Short.           Date of Birth: 1970-04-09           MRN: [id number] Visit Date: 06/24/2019              Requested by: Avis Epley, PA-C [address] [place],  [place] [address] PCP: Avis Epley, PA-C   Assessment & Plan: Visit Diagnoses:  1. Chronic pain of left knee   2. Unilateral primary osteoarthritis, left knee     Plan: Mr. Jose Short has end-stage osteoarthritis of his left knee.  He has had a course of viscosupplementation and cortisone in the past with recurrent pain to the point of compromise.  He is having trouble sleeping, walking and even perform his activities of daily employment.  He is taking Tylenol and Naprosyn.  He really would like to proceed with a knee replacement.  We have had a long discussion regarding the surgery and the procedure, the hospitalization and what he may expect postoperatively.  We have also discussed potential time to return to work and some limitations.  He like to proceed so we will have him get medical clearance and set up at the time it is convenient for him  Follow-Up Instructions: Return We will schedule left total knee replacement.   Orders:  Orders Placed This Encounter  Procedures  . XR KNEE 3 VIEW LEFT   No orders of the defined types were placed in this encounter.     Procedures: No procedures performed   Clinical Data: No additional findings.   Subjective: Chief Complaint  Patient presents with  . Left Knee - Follow-up, Pain  Patient presents today for chronic left knee pain. He was here last and finished Euflexxa injections a year ago. He states that the injections did not help. He has continued to hurt. The pain wakes him at night. His pain is all throughout the left knee. He wants to talk about total knee arthroplasty today. He is taking over the counter pain medicine for his knees.   HPI  Review of Systems   Objective: Vital Signs: Ht 5' 11.5" (1.816  m)   Wt 251 lb (113.9 kg)   BMI 34.52 kg/m   Physical Exam Constitutional:      Appearance: He is well-developed.  Eyes:     Pupils: Pupils are equal, round, and reactive to light.  Pulmonary:     Effort: Pulmonary effort is normal.  Skin:    General: Skin is warm and dry.  Neurological:     Mental Status: He is alert and oriented to person, place, and time.  Psychiatric:        Behavior: Behavior normal.     Ortho Exam awake alert and oriented x3.  Does have a limp when he first gets up from a sitting position referable to his left knee.  Very small effusion left knee.  Lacked a few degrees to full extension and flexed over 105 degrees.  No instability.  Predominately medial joint pain.  No popliteal mass.  No calf pain.  Motor exam intact.  Straight leg raise negative.  Painless range of motion left hip Specialty Comments:  No specialty comments available.  Imaging: XR KNEE 3 VIEW LEFT  Result Date: 06/24/2019 Films of the left knee were obtained in 3 projections standing and compared to films performed in 2019.  There may be slight progression of medial compartment arthritis with more narrowing  of the joint space.  There are very small peripheral osteophytes and probably 2 to 3 degrees of varus whereas the films in 2019 noted normal alignment.  There are degenerative changes in the lateral compartment as well as the patellofemoral joint.  No acute change or ectopic calcification    PMFS History: Patient Active Problem List   Diagnosis Date Noted  . Unilateral primary osteoarthritis, left knee 05/29/2018  . Nausea vomiting and diarrhea 08/03/2013  . Abnormal CT of the abdomen 08/03/2013  . DM type 2 (diabetes mellitus, type 2) ([facility]) 08/03/2013  . HTN (hypertension), benign 08/03/2013  . Hyperlipidemia 08/03/2013  . JOINT EFFUSION, RIGHT KNEE 02/18/2008  . TEAR MEDIAL MENISCUS 02/18/2008   Past Medical History:  Diagnosis Date  . Diabetes mellitus   . Hyperlipidemia     . Hypertension     Family History  Problem Relation Age of Onset  . Diabetes Mother   . Hypertension Mother   . Diabetes Father   . Hypertension Father   . Stroke Other     Past Surgical History:  Procedure Laterality Date  . HERNIA REPAIR     Social History   Occupational History  . Not on file  Tobacco Use  . Smoking status: Former Smoker    Years: 3.00    Types: Cigarettes    Quit date: 12/10/1988    Years since quitting: 30.5  . Smokeless tobacco: Current User    Types: Snuff  Substance and Sexual Activity  . Alcohol use: No  . Drug use: No  . Sexual activity: Not on file

## 2019-07-01 ENCOUNTER — Telehealth: Payer: Self-pay | Admitting: Internal Medicine

## 2019-07-01 NOTE — Telephone Encounter (Signed)
[phone]  Will need to reschedule procedure, going to have knee replacement

## 2019-07-01 NOTE — Telephone Encounter (Signed)
Called pt back and he requested to cancel his procedure for now due to upcoming knee replacement.  Pt will call us back to reschedule once he gets clearance from orthopedic after knee replacement.  LVM for Endo.

## 2019-07-03 ENCOUNTER — Other Ambulatory Visit (HOSPITAL_COMMUNITY): Payer: Self-pay

## 2019-07-06 ENCOUNTER — Ambulatory Visit (HOSPITAL_COMMUNITY): Admission: RE | Admit: 2019-07-06 | Payer: 59 | Source: Home / Self Care | Admitting: Gastroenterology

## 2019-07-06 ENCOUNTER — Encounter (HOSPITAL_COMMUNITY): Admission: RE | Payer: Self-pay | Source: Home / Self Care

## 2019-07-06 SURGERY — COLONOSCOPY
Anesthesia: Moderate Sedation

## 2019-07-07 ENCOUNTER — Telehealth: Payer: Self-pay | Admitting: Orthopaedic Surgery

## 2019-07-07 NOTE — Telephone Encounter (Signed)
FYI

## 2019-07-07 NOTE — Telephone Encounter (Signed)
thanks

## 2019-07-07 NOTE — Telephone Encounter (Signed)
I called patient to discuss a surgery date for his left total knee surgery  And also get the status of his clearance from PCP at [facility].  Patient states he is a diabetic, his numbers where slightly elevated and doctor has changed his prescription.  Patient will call me once he has been cleared.

## 2019-07-21 NOTE — H&P (Signed)
TOTAL KNEE ADMISSION H&P  Patient is being admitted for left total knee arthroplasty.  Subjective:  Chief Complaint:left knee pain.  HPI: Jose Short., 49 y.o. male, has a history of pain and functional disability in the left knee due to arthritis and has failed non-surgical conservative treatments for greater than 12 weeks to includeNSAID's and/or analgesics, corticosteriod injections, viscosupplementation injections, flexibility and strengthening excercises, use of assistive devices, weight reduction as appropriate and activity modification.  Onset of symptoms was gradual, starting >10 years ago with gradually worsening course since that time. The patient noted no past surgery on the left knee(s).  Patient currently rates pain in the left knee(s) at 8 out of 10 with activity. Patient has night pain, worsening of pain with activity and weight bearing, pain that interferes with activities of daily living, crepitus and joint swelling.  Patient has evidence of subchondral sclerosis, periarticular osteophytes and joint space narrowing by imaging studies.  There is no active infection.  Patient Active Problem List   Diagnosis Date Noted  . Unilateral primary osteoarthritis, left knee 05/29/2018  . Nausea vomiting and diarrhea 08/03/2013  . Abnormal CT of the abdomen 08/03/2013  . DM type 2 (diabetes mellitus, type 2) ([facility]) 08/03/2013  . HTN (hypertension), benign 08/03/2013  . Hyperlipidemia 08/03/2013  . JOINT EFFUSION, RIGHT KNEE 02/18/2008  . TEAR MEDIAL MENISCUS 02/18/2008   Past Medical History:  Diagnosis Date  . Diabetes mellitus   . Hyperlipidemia   . Hypertension     Past Surgical History:  Procedure Laterality Date  . HERNIA REPAIR      No current facility-administered medications for this encounter.   Current Outpatient Medications  Medication Sig Dispense Refill Last Dose  . acetaminophen (TYLENOL) 500 MG tablet Take 1,000 mg by mouth every 6 (six) hours as needed  for moderate pain or headache.      Marland Kitchen aspirin EC 325 MG tablet Take 325 mg by mouth daily.     Marland Kitchen atorvastatin (LIPITOR) 20 MG tablet Take 20 mg by mouth daily.     . Ertugliflozin-SITagliptin (STEGLUJAN) 15-100 MG TABS Take 1 tablet by mouth in the morning.     Marland Kitchen glipiZIDE (GLUCOTROL XL) 10 MG 24 hr tablet Take 20 mg by mouth daily with breakfast.      . ibuprofen (ADVIL,MOTRIN) 200 MG tablet Take 200 mg by mouth every 6 (six) hours as needed for mild pain or moderate pain.      . Insulin Glargine (BASAGLAR KWIKPEN [place]) Inject 20 Units into the skin at bedtime.     Marland Kitchen lisinopril (PRINIVIL,ZESTRIL) 10 MG tablet Take 10 mg by mouth daily.     . metFORMIN (GLUCOPHAGE) 500 MG tablet Take 1,000 mg by mouth 2 (two) times daily.     . Multiple Vitamin tablet Take 1 tablet by mouth daily.     . Potassium 99 MG TABS Take 1 tablet by mouth daily.     . metFORMIN (GLUCOPHAGE) 1000 MG tablet Take 1 tablet (1,000 mg total) by mouth 2 (two) times daily. (Patient not taking: Reported on 07/16/2019) 30 tablet 0 Not Taking at Unknown time   Allergies  Allergen Reactions  . Penicillins Rash    Social History   Tobacco Use  . Smoking status: Former Smoker    Years: 3.00    Types: Cigarettes    Quit date: 12/10/1988    Years since quitting: 30.6  . Smokeless tobacco: Current User    Types: Snuff  Substance Use Topics  .  Alcohol use: No    Family History  Problem Relation Age of Onset  . Diabetes Mother   . Hypertension Mother   . Diabetes Father   . Hypertension Father   . Stroke Other      Review of Systems  All other systems reviewed and are negative.   Objective:  Physical Exam  Constitutional: He is oriented to person, place, and time. He appears well-developed and well-nourished.  HENT:  Head: Normocephalic and atraumatic.  Eyes: Pupils are equal, round, and reactive to light. Conjunctivae are normal.  Neck: No tracheal deviation present.  Cardiovascular: Normal rate, regular rhythm,  normal heart sounds and intact distal pulses.  Respiratory: Effort normal and breath sounds normal. He has no wheezes.  GI: Soft. Bowel sounds are normal. There is no abdominal tenderness.  Musculoskeletal:     Cervical back: Neck supple.  Neurological: He is alert and oriented to person, place, and time.  Skin: Skin is warm and dry.  Psychiatric: He has a normal mood and affect. His behavior is normal. Judgment and thought content normal.  Musculoskeletal:  ROM 3-100 degrees with crepitance and mild effusion without warmth  Vital signs in last 24 hours:  Pulse Rate   84  84  05/13 1501  BP   143/89  143/89   05/13 1501  Weight (kg)     113.9-116.9  116.9 kg  05/13 1501      Estimated body mass index is 34.52 kg/m as calculated from the following:   Height as of 06/24/19: 5' 11.5" (1.816 m).   Weight as of 06/24/19: 113.9 kg.   Imaging Review Plain radiographs demonstrate moderate degenerative joint disease of the left knee(s). The overall alignment isneutral. The bone quality appears to be good for age and reported activity level.      Assessment/Plan:  End stage arthritis, left knee   The patient history, physical examination, clinical judgment of the provider and imaging studies are consistent with end stage degenerative joint disease of the left knee(s) and total knee arthroplasty is deemed medically necessary. The treatment options including medical management, injection therapy arthroscopy and arthroplasty were discussed at length. The risks and benefits of total knee arthroplasty were presented and reviewed. The risks due to aseptic loosening, infection, stiffness, patella tracking problems, thromboembolic complications and other imponderables were discussed. The patient acknowledged the explanation, agreed to proceed with the plan and consent was signed. Patient is being admitted for inpatient treatment for surgery, pain control, PT, OT, prophylactic antibiotics, VTE  prophylaxis, progressive ambulation and ADL's and discharge planning. The patient is planning to be discharged home with home health services     Patient's anticipated LOS is less than 2 midnights, meeting these requirements: - Younger than 56 - Lives within 1 hour of care - Has a competent adult at home to recover with post-op recover - NO history of  - Chronic pain requiring opiods  - Diabetes  - Coronary Artery Disease  - Heart failure  - Heart attack  - Stroke  - DVT/VTE  - Cardiac arrhythmia  - Respiratory Failure/COPD  - Renal failure  - Anemia  - Advanced Liver disease

## 2019-07-22 NOTE — Patient Instructions (Addendum)
DUE TO COVID-19 ONLY ONE VISITOR IS ALLOWED TO COME WITH YOU AND STAY IN THE WAITING ROOM ONLY DURING PRE OP AND PROCEDURE DAY OF SURGERY. THE 1 VISITOR MAY VISIT WITH YOU AFTER SURGERY IN YOUR PRIVATE ROOM DURING VISITING HOURS ONLY!  YOU NEED TO HAVE A COVID 19 TEST ON: 07/31/19  @ 3:15 pm, THIS TEST MUST BE DONE BEFORE SURGERY, COME   [facility]. ONCE YOUR COVID TEST IS COMPLETED, PLEASE BEGIN THE QUARANTINE INSTRUCTIONS AS OUTLINED IN YOUR HANDOUT.                Lenice Pressman.    Your procedure is scheduled on: 08/04/19   Report to [facility] Main  Entrance   Report to admitting at: 7:15 AM     Call this number if you have problems the morning of surgery [phone]    Remember:   NO SOLID FOOD AFTER MIDNIGHT THE NIGHT PRIOR TO SURGERY. NOTHING BY MOUTH EXCEPT CLEAR LIQUIDS UNTIL: 6:45 am . PLEASE FINISH GATORADE DRINK PER SURGEON ORDER  WHICH NEEDS TO BE COMPLETED AT: 6:45 am .   CLEAR LIQUID DIET   Foods Allowed                                                                     Foods Excluded  Coffee and tea, regular and decaf                             liquids that you cannot  Plain Jell-O any favor except red or purple                                           see through such as: Fruit ices (not with fruit pulp)                                     milk, soups, orange juice  Iced Popsicles                                    All solid food Carbonated beverages, regular and diet                                    Cranberry, grape and apple juices Sports drinks like Gatorade Lightly seasoned clear broth or consume(fat free) Sugar, honey syrup  Sample Menu Breakfast                                Lunch                                     Supper Cranberry juice  Beef broth                            Chicken broth Jell-O                                     Grape juice                           Apple juice Coffee or tea                         Jell-O                                      Popsicle                                                Coffee or tea                        Coffee or tea  _____________________________________________________________________  How to Manage Your Diabetes Before and After Surgery  Why is it important to control my blood sugar before and after surgery? . Improving blood sugar levels before and after surgery helps healing and can limit problems. . A way of improving blood sugar control is eating a healthy diet by: o  Eating less sugar and carbohydrates o  Increasing activity/exercise o  Talking with your doctor about reaching your blood sugar goals . High blood sugars (greater than 180 mg/dL) can raise your risk of infections and slow your recovery, so you will need to focus on controlling your diabetes during the weeks before surgery. . Make sure that the doctor who takes care of your diabetes knows about your planned surgery including the date and location.  How do I manage my blood sugar before surgery? . Check your blood sugar at least 4 times a day, starting 2 days before surgery, to make sure that the level is not too high or low. o Check your blood sugar the morning of your surgery when you wake up and every 2 hours until you get to the Short Stay unit. . If your blood sugar is less than 70 mg/dL, you will need to treat for low blood sugar: o Do not take insulin. o Treat a low blood sugar (less than 70 mg/dL) with  cup of clear juice (cranberry or apple), 4 glucose tablets, OR glucose gel. o Recheck blood sugar in 15 minutes after treatment (to make sure it is greater than 70 mg/dL). If your blood sugar is not greater than 70 mg/dL on recheck, call [phone] for further instructions. . Report your blood sugar to the short stay nurse when you get to Short Stay.  . If you are admitted to the hospital after surgery: o Your blood sugar will be checked by the staff and you will  probably be given insulin after surgery (instead of oral diabetes medicines) to make sure you have good blood sugar levels. o The goal for blood sugar control after surgery is 80-180 mg/dL.   WHAT DO I DO  ABOUT MY DIABETES MEDICATION?  Marland Kitchen Do not take oral diabetes medicines (pills) the morning of surgery.  . THE DAY BEFORE SURGERY, take Metformin and glipizide as usual.Do not take steglujan.Take only half of the basaglar ( 10.units ) insulin.       . THE MORNING OF SURGERY, Do not take  Any diabetes medicine.    BRUSH YOUR TEETH MORNING OF SURGERY AND RINSE YOUR MOUTH OUT, NO CHEWING GUM CANDY OR MINTS.                                  You may not have any metal on your body including hair pins and              piercings  Do not wear jewelry, lotions, powders or perfumes, deodorant             Men may shave face and neck.   Do not bring valuables to the hospital. [place].  Contacts, dentures or bridgework may not be worn into surgery.  Leave suitcase in the car. After surgery it may be brought to your room.     Patients discharged the day of surgery will not be allowed to drive home. IF YOU ARE HAVING SURGERY AND GOING HOME THE SAME DAY, YOU MUST HAVE AN ADULT TO DRIVE YOU HOME AND BE WITH YOU FOR 24 HOURS. YOU MAY GO HOME BY TAXI OR UBER OR ORTHERWISE, BUT AN ADULT MUST ACCOMPANY YOU HOME AND STAY WITH YOU FOR 24 HOURS.  Name and phone number of your driver:  Special Instructions: N/A              Please read over the following fact sheets you were given: _____________________________________________________________________  [facility] - Preparing for Surgery Before surgery, you can play an important role.  Because skin is not sterile, your skin needs to be as free of germs as possible.  You can reduce the number of germs on your skin by washing with CHG (chlorahexidine gluconate) soap before surgery.  CHG is an antiseptic  cleaner which kills germs and bonds with the skin to continue killing germs even after washing. Please DO NOT use if you have an allergy to CHG or antibacterial soaps.  If your skin becomes reddened/irritated stop using the CHG and inform your nurse when you arrive at Short Stay. Do not shave (including legs and underarms) for at least 48 hours prior to the first CHG shower.  You may shave your face/neck. Please follow these instructions carefully:  1.  Shower with CHG Soap the night before surgery and the  morning of Surgery.  2.  If you choose to wash your hair, wash your hair first as usual with your  normal  shampoo.  3.  After you shampoo, rinse your hair and body thoroughly to remove the  shampoo.                           4.  Use CHG as you would any other liquid soap.  You can apply chg directly  to the skin and wash                       Gently with a scrungie or clean washcloth.  5.  Apply the CHG Soap to your body ONLY FROM THE NECK DOWN.   Do not use on face/ open                           Wound or open sores. Avoid contact with eyes, ears mouth and genitals (private parts).                       Wash face,  Genitals (private parts) with your normal soap.             6.  Wash thoroughly, paying special attention to the area where your surgery  will be performed.  7.  Thoroughly rinse your body with warm water from the neck down.  8.  DO NOT shower/wash with your normal soap after using and rinsing off  the CHG Soap.                9.  Pat yourself dry with a clean towel.            10.  Wear clean pajamas.            11.  Place clean sheets on your bed the night of your first shower and do not  sleep with pets. Day of Surgery : Do not apply any lotions/deodorants the morning of surgery.  Please wear clean clothes to the hospital/surgery center.  FAILURE TO FOLLOW THESE INSTRUCTIONS MAY RESULT IN THE CANCELLATION OF YOUR SURGERY PATIENT  SIGNATURE_________________________________  NURSE SIGNATURE__________________________________  ________________________________________________________________________   Rogelia Mire  An incentive spirometer is a tool that can help keep your lungs clear and active. This tool measures how well you are filling your lungs with each breath. Taking long deep breaths may help reverse or decrease the chance of developing breathing (pulmonary) problems (especially infection) following:  A long period of time when you are unable to move or be active. BEFORE THE PROCEDURE   If the spirometer includes an indicator to show your best effort, your nurse or respiratory therapist will set it to a desired goal.  If possible, sit up straight or lean slightly forward. Try not to slouch.  Hold the incentive spirometer in an upright position. INSTRUCTIONS FOR USE  1. Sit on the edge of your bed if possible, or sit up as far as you can in bed or on a chair. 2. Hold the incentive spirometer in an upright position. 3. Breathe out normally. 4. Place the mouthpiece in your mouth and seal your lips tightly around it. 5. Breathe in slowly and as deeply as possible, raising the piston or the ball toward the top of the column. 6. Hold your breath for 3-5 seconds or for as long as possible. Allow the piston or ball to fall to the bottom of the column. 7. Remove the mouthpiece from your mouth and breathe out normally. 8. Rest for a few seconds and repeat Steps 1 through 7 at least 10 times every 1-2 hours when you are awake. Take your time and take a few normal breaths between deep breaths. 9. The spirometer may include an indicator to show your best effort. Use the indicator as a goal to work toward during each repetition. 10. After each set of 10 deep breaths, practice coughing to be sure your lungs are clear. If you have an incision (the cut made at the time of surgery), support your incision when coughing  by placing a pillow  or rolled up towels firmly against it. Once you are able to get out of bed, walk around indoors and cough well. You may stop using the incentive spirometer when instructed by your caregiver.  RISKS AND COMPLICATIONS  Take your time so you do not get dizzy or light-headed.  If you are in pain, you may need to take or ask for pain medication before doing incentive spirometry. It is harder to take a deep breath if you are having pain. AFTER USE  Rest and breathe slowly and easily.  It can be helpful to keep track of a log of your progress. Your caregiver can provide you with a simple table to help with this. If you are using the spirometer at home, follow these instructions: SEEK MEDICAL CARE IF:   You are having difficultly using the spirometer.  You have trouble using the spirometer as often as instructed.  Your pain medication is not giving enough relief while using the spirometer.  You develop fever of 100.5 F (38.1 C) or higher. SEEK IMMEDIATE MEDICAL CARE IF:   You cough up bloody sputum that had not been present before.  You develop fever of 102 F (38.9 C) or greater.  You develop worsening pain at or near the incision site. MAKE SURE YOU:   Understand these instructions.  Will watch your condition.  Will get help right away if you are not doing well or get worse. Document Released: 07/09/2006 Document Revised: 05/21/2011 Document Reviewed: 09/09/2006 [facility] Patient Information 2014 [place], [place].   ________________________________________________________________________

## 2019-07-23 ENCOUNTER — Other Ambulatory Visit: Payer: Self-pay

## 2019-07-23 ENCOUNTER — Encounter: Payer: Self-pay | Admitting: Orthopaedic Surgery

## 2019-07-23 ENCOUNTER — Encounter (HOSPITAL_COMMUNITY): Payer: Self-pay

## 2019-07-23 ENCOUNTER — Ambulatory Visit (INDEPENDENT_AMBULATORY_CARE_PROVIDER_SITE_OTHER): Payer: PRIVATE HEALTH INSURANCE | Admitting: Orthopaedic Surgery

## 2019-07-23 ENCOUNTER — Encounter (HOSPITAL_COMMUNITY)
Admission: RE | Admit: 2019-07-23 | Discharge: 2019-07-23 | Disposition: A | Discharge status: Home / Self Care | Payer: PRIVATE HEALTH INSURANCE | Source: Ambulatory Visit | Attending: Orthopaedic Surgery | Admitting: Orthopaedic Surgery

## 2019-07-23 VITALS — BP 143/89 | HR 84 | Ht 72.0 in | Wt 257.8 lb

## 2019-07-23 DIAGNOSIS — Z01812 Encounter for preprocedural laboratory examination: Secondary | ICD-10-CM | POA: Diagnosis not present

## 2019-07-23 DIAGNOSIS — M1712 Unilateral primary osteoarthritis, left knee: Secondary | ICD-10-CM

## 2019-07-23 HISTORY — DX: Unspecified osteoarthritis, unspecified site: M19.90

## 2019-07-23 NOTE — Progress Notes (Signed)
Chief Complaint:left knee pain.  HPI: Jose Short., 49 y.o. male, has a history of pain and functional disability in the left knee due to arthritis and has failed non-surgical conservative treatments for greater than 12 weeks to includeNSAID's and/or analgesics, corticosteriod injections, viscosupplementation injections, flexibility and strengthening excercises, use of assistive devices, weight reduction as appropriate and activity modification.  Onset of symptoms was gradual, starting >10 years ago with gradually worsening course since that time. The patient noted no past surgery on the left knee(s).  Patient currently rates pain in the left knee(s) at 8 out of 10 with activity. Patient has night pain, worsening of pain with activity and weight bearing, pain that interferes with activities of daily living, crepitus and joint swelling.  Patient has evidence of subchondral sclerosis, periarticular osteophytes and joint space narrowing by imaging studies.  There is no active infection.  Patient Active Problem List   Diagnosis Date Noted  . Unilateral primary osteoarthritis, left knee 05/29/2018  . Nausea vomiting and diarrhea 08/03/2013  . Abnormal CT of the abdomen 08/03/2013  . DM type 2 (diabetes mellitus, type 2) ([place]) 08/03/2013  . HTN (hypertension), benign 08/03/2013  . Hyperlipidemia 08/03/2013  . JOINT EFFUSION, RIGHT KNEE 02/18/2008  . TEAR MEDIAL MENISCUS 02/18/2008   Past Medical History:  Diagnosis Date  . Diabetes mellitus   . Hyperlipidemia   . Hypertension     Past Surgical History:  Procedure Laterality Date  . HERNIA REPAIR      No current facility-administered medications for this encounter.   Current Outpatient Medications  Medication Sig Dispense Refill Last Dose  . acetaminophen (TYLENOL) 500 MG tablet Take 1,000 mg by mouth every 6 (six) hours as needed for moderate pain or headache.      Marland Kitchen aspirin EC 325 MG tablet Take 325 mg by mouth daily.     Marland Kitchen  atorvastatin (LIPITOR) 20 MG tablet Take 20 mg by mouth daily.     . Ertugliflozin-SITagliptin (STEGLUJAN) 15-100 MG TABS Take 1 tablet by mouth in the morning.     Marland Kitchen glipiZIDE (GLUCOTROL XL) 10 MG 24 hr tablet Take 20 mg by mouth daily with breakfast.      . ibuprofen (ADVIL,MOTRIN) 200 MG tablet Take 200 mg by mouth every 6 (six) hours as needed for mild pain or moderate pain.      . Insulin Glargine (BASAGLAR KWIKPEN [place]) Inject 20 Units into the skin at bedtime.     Marland Kitchen lisinopril (PRINIVIL,ZESTRIL) 10 MG tablet Take 10 mg by mouth daily.     . metFORMIN (GLUCOPHAGE) 500 MG tablet Take 1,000 mg by mouth 2 (two) times daily.     . Multiple Vitamin tablet Take 1 tablet by mouth daily.     . Potassium 99 MG TABS Take 1 tablet by mouth daily.     . metFORMIN (GLUCOPHAGE) 1000 MG tablet Take 1 tablet (1,000 mg total) by mouth 2 (two) times daily. (Patient not taking: Reported on 07/16/2019) 30 tablet 0 Not Taking at Unknown time   Allergies  Allergen Reactions  . Penicillins Rash    Social History   Tobacco Use  . Smoking status: Former Smoker    Years: 3.00    Types: Cigarettes    Quit date: 12/10/1988    Years since quitting: 30.6  . Smokeless tobacco: Current User    Types: Snuff  Substance Use Topics  . Alcohol use: No    Family History  Problem Relation Age of Onset  .  Diabetes Mother   . Hypertension Mother   . Diabetes Father   . Hypertension Father   . Stroke Other      Review of Systems  All other systems reviewed and are negative.   Objective:  Physical Exam  Constitutional: He is oriented to person, place, and time. He appears well-developed and well-nourished.  HENT:  Head: Normocephalic and atraumatic.  Eyes: Pupils are equal, round, and reactive to light. Conjunctivae are normal.  Neck: No tracheal deviation present.  Cardiovascular: Normal rate, regular rhythm, normal heart sounds and intact distal pulses.  Respiratory: Effort normal and breath sounds normal.  He has no wheezes.  GI: Soft. Bowel sounds are normal. There is no abdominal tenderness.  Musculoskeletal:     Cervical back: Neck supple.  Neurological: He is alert and oriented to person, place, and time.  Skin: Skin is warm and dry.  Psychiatric: He has a normal mood and affect. His behavior is normal. Judgment and thought content normal.  Musculoskeletal:  ROM 3-100 degrees with crepitance and mild effusion without warmth  Vital signs in last 24 hours:  Pulse Rate   84  84  05/13 1501  BP   143/89  143/89   05/13 1501  Weight (kg)     113.9-116.9  116.9 kg  05/13 1501      Estimated body mass index is 34.52 kg/m as calculated from the following:   Height as of 06/24/19: 5' 11.5" (1.816 m).   Weight as of 06/24/19: 113.9 kg.   Imaging Review Plain radiographs demonstrate moderate degenerative joint disease of the left knee(s). The overall alignment isneutral. The bone quality appears to be good for age and reported activity level.      Assessment/Plan:  End stage arthritis, left knee   The patient history, physical examination, clinical judgment of the provider and imaging studies are consistent with end stage degenerative joint disease of the left knee(s) and total knee arthroplasty is deemed medically necessary. The treatment options including medical management, injection therapy arthroscopy and arthroplasty were discussed at length. The risks and benefits of total knee arthroplasty were presented and reviewed. The risks due to aseptic loosening, infection, stiffness, patella tracking problems, thromboembolic complications and other imponderables were discussed. The patient acknowledged the explanation, agreed to proceed with the plan and consent was signed. Patient is being admitted for inpatient treatment for surgery, pain control, PT, OT, prophylactic antibiotics, VTE prophylaxis, progressive ambulation and ADL's and discharge planning. The patient is planning to be  discharged home with home health services   Face-to-face time spent with patient was greater than 40 minutes.  Greater than 50% of the time was spent in counseling and coordination of care.  Oris Drone Daphine Deutscher [phone]  07/23/2019 3:52 PM

## 2019-07-23 NOTE — Progress Notes (Signed)
PCP - Terie Purser [facility] Cardiologist -   Chest x-ray -  EKG -  Stress Test -  ECHO -  Cardiac Cath -   Sleep Study -  CPAP -   Fasting Blood Sugar -  Checks Blood Sugar _____ times a day  Blood Thinner Instructions: Aspirin Instructions: RN advised pt. To ask for instruction for aspirin 325 mg. Last Dose:  Anesthesia review:   Patient denies shortness of breath, fever, cough and chest pain at PAT appointment   Patient verbalized understanding of instructions that were given to them at the PAT appointment. Patient was also instructed that they will need to review over the PAT instructions again at home before surgery.

## 2019-07-31 ENCOUNTER — Other Ambulatory Visit: Payer: Self-pay

## 2019-07-31 ENCOUNTER — Ambulatory Visit (HOSPITAL_COMMUNITY)
Admission: RE | Admit: 2019-07-31 | Discharge: 2019-07-31 | Disposition: A | Discharge status: Home / Self Care | Payer: PRIVATE HEALTH INSURANCE | Source: Ambulatory Visit | Attending: Orthopedic Surgery | Admitting: Orthopedic Surgery

## 2019-07-31 ENCOUNTER — Other Ambulatory Visit (HOSPITAL_COMMUNITY)
Admission: RE | Admit: 2019-07-31 | Discharge: 2019-07-31 | Disposition: A | Discharge status: Home / Self Care | Payer: PRIVATE HEALTH INSURANCE | Source: Ambulatory Visit | Attending: Orthopaedic Surgery | Admitting: Orthopaedic Surgery

## 2019-07-31 ENCOUNTER — Encounter (HOSPITAL_COMMUNITY)
Admission: RE | Admit: 2019-07-31 | Discharge: 2019-07-31 | Disposition: A | Discharge status: Home / Self Care | Payer: PRIVATE HEALTH INSURANCE | Source: Ambulatory Visit | Attending: Orthopaedic Surgery | Admitting: Orthopaedic Surgery

## 2019-07-31 DIAGNOSIS — Z20822 Contact with and (suspected) exposure to covid-19: Secondary | ICD-10-CM | POA: Diagnosis not present

## 2019-07-31 DIAGNOSIS — Z01818 Encounter for other preprocedural examination: Secondary | ICD-10-CM | POA: Insufficient documentation

## 2019-07-31 LAB — COMPREHENSIVE METABOLIC PANEL
ALT: 30 U/L (ref 0–44)
AST: 28 U/L (ref 15–41)
Albumin: 4.1 g/dL (ref 3.5–5.0)
Alkaline Phosphatase: 47 U/L (ref 38–126)
Anion gap: 10 (ref 5–15)
BUN: 17 mg/dL (ref 6–20)
CO2: 25 mmol/L (ref 22–32)
Calcium: 9.1 mg/dL (ref 8.9–10.3)
Chloride: 101 mmol/L (ref 98–111)
Creatinine, Ser: 0.75 mg/dL (ref 0.61–1.24)
GFR calc Af Amer: >60 mL/min (ref >60)
GFR calc non Af Amer: >60 mL/min (ref >60)
Glucose, Bld: 136 mg/dL — ABNORMAL HIGH (ref 70–99)
Potassium: 4.6 mmol/L (ref 3.5–5.1)
Sodium: 136 mmol/L (ref 135–145)
Total Bilirubin: 1 mg/dL (ref 0.3–1.2)
Total Protein: 7.5 g/dL (ref 6.5–8.1)

## 2019-07-31 LAB — CBC WITH DIFFERENTIAL/PLATELET
Abs Immature Granulocytes: 0.01 10*3/uL (ref 0.00–0.07)
Basophils Absolute: 0.1 10*3/uL (ref 0.0–0.1)
Basophils Relative: 1 %
Eosinophils Absolute: 0.2 10*3/uL (ref 0.0–0.5)
Eosinophils Relative: 2 %
HCT: 48.4 % (ref 39.0–52.0)
Hemoglobin: 15.1 g/dL (ref 13.0–17.0)
Immature Granulocytes: 0 %
Lymphocytes Relative: 31 %
Lymphs Abs: 2.1 10*3/uL (ref 0.7–4.0)
MCH: 27.1 pg (ref 26.0–34.0)
MCHC: 31.2 g/dL (ref 30.0–36.0)
MCV: 86.7 fL (ref 80.0–100.0)
Monocytes Absolute: 0.8 10*3/uL (ref 0.1–1.0)
Monocytes Relative: 11 %
Neutro Abs: 3.8 10*3/uL (ref 1.7–7.7)
Neutrophils Relative %: 55 %
Platelets: 398 10*3/uL (ref 150–400)
RBC: 5.58 MIL/uL (ref 4.22–5.81)
RDW: 13.1 % (ref 11.5–15.5)
WBC: 6.9 10*3/uL (ref 4.0–10.5)
nRBC: 0 % (ref 0.0–0.2)

## 2019-07-31 LAB — URINALYSIS, ROUTINE W REFLEX MICROSCOPIC
Bacteria, UA: NONE SEEN
Bilirubin Urine: NEGATIVE
Glucose, UA: >=500 mg/dL — AB
Hgb urine dipstick: NEGATIVE
Ketones, ur: NEGATIVE mg/dL
Leukocytes,Ua: NEGATIVE
Nitrite: NEGATIVE
Protein, ur: NEGATIVE mg/dL
Specific Gravity, Urine: 1.029 (ref 1.005–1.030)
pH: 6 (ref 5.0–8.0)

## 2019-07-31 LAB — HEMOGLOBIN A1C
Hgb A1c MFr Bld: 10.9 % — ABNORMAL HIGH (ref 4.8–5.6)
Mean Plasma Glucose: 266.13 mg/dL

## 2019-07-31 LAB — APTT: aPTT: 33 seconds (ref 24–36)

## 2019-07-31 LAB — SURGICAL PCR SCREEN
MRSA, PCR: NEGATIVE
Staphylococcus aureus: NEGATIVE

## 2019-07-31 LAB — PROTIME-INR
INR: 1 (ref 0.8–1.2)
Prothrombin Time: 12.3 seconds (ref 11.4–15.2)

## 2019-07-31 LAB — GLUCOSE, CAPILLARY: Glucose-Capillary: 139 mg/dL — ABNORMAL HIGH (ref 70–99)

## 2019-08-01 LAB — SARS CORONAVIRUS 2 (TAT 6-24 HRS): SARS Coronavirus 2: NEGATIVE

## 2019-08-01 LAB — URINE CULTURE: Culture: NO GROWTH

## 2019-08-01 LAB — ABO/RH: ABO/RH(D): A NEG

## 2019-08-03 ENCOUNTER — Encounter (HOSPITAL_COMMUNITY): Payer: Self-pay | Admitting: Anesthesiology

## 2019-08-03 MED ORDER — VANCOMYCIN HCL 1500 MG/300ML IV SOLN
1500.0000 mg | INTRAVENOUS | Status: AC
  Filled 2019-08-03: qty 300

## 2019-08-03 NOTE — Progress Notes (Signed)
Lab results: A1C: 10.9

## 2019-08-04 ENCOUNTER — Ambulatory Visit (HOSPITAL_COMMUNITY)
Admission: RE | Admit: 2019-08-04 | Payer: PRIVATE HEALTH INSURANCE | Source: Home / Self Care | Admitting: Orthopaedic Surgery

## 2019-08-04 ENCOUNTER — Telehealth: Payer: Self-pay | Admitting: Orthopaedic Surgery

## 2019-08-04 LAB — TYPE AND SCREEN
ABO/RH(D): A NEG
Antibody Screen: NEGATIVE

## 2019-08-04 SURGERY — ARTHROPLASTY, KNEE, TOTAL
Anesthesia: Choice | Site: Knee | Laterality: Left

## 2019-08-04 NOTE — Telephone Encounter (Signed)
Called again and was able to speak with patient. His wife will come pick up letter tomorrow.

## 2019-08-04 NOTE — Telephone Encounter (Signed)
Please advise 

## 2019-08-04 NOTE — Telephone Encounter (Signed)
Tried to call patient. No answer. Left message that note is up front at check in.  Called [phone], because the number he preferred did not have a voicemail.

## 2019-08-04 NOTE — Telephone Encounter (Signed)
Ok to write note as above-please call pt when note is ready

## 2019-08-04 NOTE — Telephone Encounter (Signed)
Spoke with [facility] regarding patient's surgery being cancelled due to A1c reading of 10.9.  Patient was advised to schedule appointment with Terie Purser, PA-C to go over medication and discuss need for getting the A1c numbers down so he can move forward with much needed knee surgery.  Appointment with PCP is set for this Friday May 28th.  (provider is only in the office on Thursdays & Fridays).  Patient is requesting a note to provide his employer allowing him to remain out of work this week and allowing him to return on Tuesday June 1st.  Please let him know what his options are for obtaining the note.  Patient said he does not mind coming by to pick up note today or tomorrow.  Please call when ready.  Patient can be reached at the following number:    [phone]

## 2019-08-18 ENCOUNTER — Inpatient Hospital Stay: Payer: PRIVATE HEALTH INSURANCE | Admitting: Orthopedic Surgery

## 2020-11-25 DIAGNOSIS — I10 Essential (primary) hypertension: Secondary | ICD-10-CM | POA: Diagnosis not present

## 2020-11-25 DIAGNOSIS — Z6838 Body mass index (BMI) 38.0-38.9, adult: Secondary | ICD-10-CM | POA: Diagnosis not present

## 2020-11-25 DIAGNOSIS — E1165 Type 2 diabetes mellitus with hyperglycemia: Secondary | ICD-10-CM | POA: Diagnosis not present

## 2020-11-25 DIAGNOSIS — E782 Mixed hyperlipidemia: Secondary | ICD-10-CM | POA: Diagnosis not present

## 2020-12-02 DIAGNOSIS — N5201 Erectile dysfunction due to arterial insufficiency: Secondary | ICD-10-CM | POA: Diagnosis not present

## 2021-03-02 DIAGNOSIS — Z6839 Body mass index (BMI) 39.0-39.9, adult: Secondary | ICD-10-CM | POA: Diagnosis not present

## 2021-03-02 DIAGNOSIS — I10 Essential (primary) hypertension: Secondary | ICD-10-CM | POA: Diagnosis not present

## 2021-03-02 DIAGNOSIS — E1165 Type 2 diabetes mellitus with hyperglycemia: Secondary | ICD-10-CM | POA: Diagnosis not present

## 2021-03-02 DIAGNOSIS — E782 Mixed hyperlipidemia: Secondary | ICD-10-CM | POA: Diagnosis not present

## 2021-06-25 DIAGNOSIS — Z7982 Long term (current) use of aspirin: Secondary | ICD-10-CM | POA: Diagnosis not present

## 2021-06-25 DIAGNOSIS — Z794 Long term (current) use of insulin: Secondary | ICD-10-CM | POA: Diagnosis not present

## 2021-06-25 DIAGNOSIS — W57XXXA Bitten or stung by nonvenomous insect and other nonvenomous arthropods, initial encounter: Secondary | ICD-10-CM | POA: Diagnosis not present

## 2021-06-25 DIAGNOSIS — E785 Hyperlipidemia, unspecified: Secondary | ICD-10-CM | POA: Diagnosis not present

## 2021-06-25 DIAGNOSIS — Z79899 Other long term (current) drug therapy: Secondary | ICD-10-CM | POA: Diagnosis not present

## 2021-06-25 DIAGNOSIS — Z7984 Long term (current) use of oral hypoglycemic drugs: Secondary | ICD-10-CM | POA: Diagnosis not present

## 2021-06-25 DIAGNOSIS — S30860A Insect bite (nonvenomous) of lower back and pelvis, initial encounter: Secondary | ICD-10-CM | POA: Diagnosis not present

## 2021-06-25 DIAGNOSIS — I10 Essential (primary) hypertension: Secondary | ICD-10-CM | POA: Diagnosis not present

## 2021-06-25 DIAGNOSIS — Z880 Allergy status to penicillin: Secondary | ICD-10-CM | POA: Diagnosis not present

## 2021-06-25 DIAGNOSIS — F17290 Nicotine dependence, other tobacco product, uncomplicated: Secondary | ICD-10-CM | POA: Diagnosis not present

## 2021-06-25 DIAGNOSIS — R21 Rash and other nonspecific skin eruption: Secondary | ICD-10-CM | POA: Diagnosis not present

## 2021-06-25 DIAGNOSIS — E119 Type 2 diabetes mellitus without complications: Secondary | ICD-10-CM | POA: Diagnosis not present

## 2021-07-25 ENCOUNTER — Ambulatory Visit
Admission: EM | Admit: 2021-07-25 | Discharge: 2021-07-25 | Disposition: A | Discharge status: Home / Self Care | Payer: BC Managed Care – PPO | Attending: Family Medicine | Admitting: Family Medicine

## 2021-07-25 DIAGNOSIS — M549 Dorsalgia, unspecified: Secondary | ICD-10-CM

## 2021-07-25 MED ORDER — CYCLOBENZAPRINE HCL 10 MG PO TABS: 10.0000 mg | ORAL_TABLET | Freq: Three times a day (TID) | ORAL | 0 refills | Status: DC | PRN

## 2021-07-25 MED ORDER — DEXAMETHASONE SODIUM PHOSPHATE 10 MG/ML IJ SOLN
10.0000 mg | Freq: Once | INTRAMUSCULAR | Status: AC
Start: 2021-07-25 — End: 2021-07-25
  Administered 2021-07-25: 10 mg via INTRAMUSCULAR

## 2021-07-25 NOTE — ED Provider Notes (Signed)
?[facility] ? ? ? ?CSN: [id number] ?Arrival date & time: 07/25/21  1741 ? ? ?  ? ?History   ?Chief Complaint ?Chief Complaint  ?Patient presents with  ? Back Pain  ? ? ?HPI ?Jose Short. is a 51 y.o. male.  ? ?Presenting today with over a week of right-sided mid back pain, stiffness, soreness with no obvious injury.  Denies midline back pain, leg weakness, numbness, tingling, radiation of pain down legs, bowel or bladder incontinence, saddle anesthesia, fevers.  States movement makes the pain worse, rest and certain positions, heat, over-the-counter pain relievers make it better.  No past history of chronic back issues. ? ? ?Past Medical History:  ?Diagnosis Date  ? Arthritis   ? Diabetes mellitus   ? Hyperlipidemia   ? Hypertension   ? ? ?Patient Active Problem List  ? Diagnosis Date Noted  ? Unilateral primary osteoarthritis, left knee 05/29/2018  ? Nausea vomiting and diarrhea 08/03/2013  ? Abnormal CT of the abdomen 08/03/2013  ? DM type 2 (diabetes mellitus, type 2) (HCC) 08/03/2013  ? HTN (hypertension), benign 08/03/2013  ? Hyperlipidemia 08/03/2013  ? JOINT EFFUSION, RIGHT KNEE 02/18/2008  ? TEAR MEDIAL MENISCUS 02/18/2008  ? ? ?Past Surgical History:  ?Procedure Laterality Date  ? HERNIA REPAIR    ? ? ? ? ? ?Home Medications   ? ?Prior to Admission medications   ?Medication Sig Start Date End Date Taking? Authorizing Provider  ?cyclobenzaprine (FLEXERIL) 10 MG tablet Take 1 tablet (10 mg total) by mouth 3 (three) times daily as needed for muscle spasms. Do not drink alcohol or drive while taking this medication. May cause drowsiness 07/25/21  Yes Particia Nearing, PA-C  ?acetaminophen (TYLENOL) 500 MG tablet Take 1,000 mg by mouth every 6 (six) hours as needed for moderate pain or headache.     [provider]  ?aspirin EC 325 MG tablet Take 325 mg by mouth daily.    [provider]  ?atorvastatin (LIPITOR) 20 MG tablet Take 20 mg by mouth daily.    [provider]  ?Ertugliflozin-SITagliptin (STEGLUJAN) 15-100 MG TABS Take 1 tablet by mouth in the morning.    [provider]  ?glipiZIDE (GLUCOTROL XL) 10 MG 24 hr tablet Take 20 mg by mouth daily with breakfast.     [provider]  ?ibuprofen (ADVIL,MOTRIN) 200 MG tablet Take 200 mg by mouth every 6 (six) hours as needed for mild pain or moderate pain.     [provider]  ?Insulin Glargine (BASAGLAR KWIKPEN [place]) Inject 20 Units into the skin at bedtime.    [provider]  ?lisinopril (PRINIVIL,ZESTRIL) 10 MG tablet Take 10 mg by mouth daily.    [provider]  ?metFORMIN (GLUCOPHAGE) 1000 MG tablet Take 1 tablet (1,000 mg total) by mouth 2 (two) times daily. ?Patient not taking: Reported on 07/16/2019 10/03/15   Burgess Amor, PA-C  ?metFORMIN (GLUCOPHAGE) 500 MG tablet Take 1,000 mg by mouth 2 (two) times daily. 06/24/19   [provider]  ?Multiple Vitamin tablet Take 1 tablet by mouth daily.    [provider]  ?Potassium 99 MG TABS Take 1 tablet by mouth daily.    [provider]  ? ? ?Family History ?Family History  ?Problem Relation Age of Onset  ? Diabetes Mother   ? Hypertension Mother   ? Diabetes Father   ? Hypertension Father   ? Stroke Other   ? ? ?Social History ?Social History  ? ?  Tobacco Use  ? Smoking status: Former  ?  Years: 3.00  ?  Types: Cigarettes  ?  Quit date: 12/10/1988  ?  Years since quitting: 32.6  ? Smokeless tobacco: Current  ?  Types: Snuff  ?Vaping Use  ? Vaping Use: Never used  ?Substance Use Topics  ? Alcohol use: No  ? Drug use: No  ? ? ? ?Allergies   ?Penicillins ? ? ?Review of Systems ?Review of Systems ?Per HPI ? ?Physical Exam ?Triage Vital Signs ?ED Triage Vitals [07/25/21 1856]  ?Enc Vitals Group  ?   BP (!) 142/86  ?   Pulse Rate 73  ?   Resp 16  ?   Temp 97.6 ?F (36.4 ?C)  ?   Temp Source Oral  ?   SpO2 95 %  ?   Weight 275 lb (124.7 kg)  ?   Height 6\' 2"  (1.88 m)  ?   Head Circumference   ?   Peak Flow    ?   Pain Score 8  ?   Pain Loc   ?   Pain Edu?   ?   Excl. in GC?   ? ?No data found. ? ?Updated Vital Signs ?BP (!) 142/86 (BP Location: Right Arm)   Pulse 73   Temp 97.6 ?F (36.4 ?C) (Oral)   Resp 16   Ht 6\' 2"  (1.88 m)   Wt 275 lb (124.7 kg)   SpO2 95%   BMI 35.31 kg/m?  ? ?Visual Acuity ?Right Eye Distance:   ?Left Eye Distance:   ?Bilateral Distance:   ? ?Right Eye Near:   ?Left Eye Near:    ?Bilateral Near:    ? ?Physical Exam ?Vitals and nursing note reviewed.  ?Constitutional:   ?   Appearance: Normal appearance.  ?HENT:  ?   Head: Atraumatic.  ?Eyes:  ?   Extraocular Movements: Extraocular movements intact.  ?   Conjunctiva/sclera: Conjunctivae normal.  ?Cardiovascular:  ?   Rate and Rhythm: Normal rate and regular rhythm.  ?Pulmonary:  ?   Effort: Pulmonary effort is normal.  ?   Breath sounds: Normal breath sounds.  ?Musculoskeletal:     ?   General: Tenderness present. No swelling or deformity. Normal range of motion.  ?   Cervical back: Normal range of motion and neck supple.  ?   Comments: No midline spinal tenderness to palpation diffusely.  Negative straight leg raise bilateral lower extremities.  Normal gait.  Right lateral mid back musculature tender to palpation  ?Skin: ?   General: Skin is warm and dry.  ?   Findings: No bruising or erythema.  ?Neurological:  ?   General: No focal deficit present.  ?   Mental Status: He is oriented to person, place, and time.  ?   Comments: Bilateral lower extremities neurovascularly intact  ?Psychiatric:     ?   Mood and Affect: Mood normal.     ?   Thought Content: Thought content normal.     ?   Judgment: Judgment normal.  ? ? ? ?UC Treatments / Results  ?Labs ?(all labs ordered are listed, but only abnormal results are displayed) ?Labs Reviewed - No data to display ? ?EKG ? ? ?Radiology ?No results found. ? ?Procedures ?Procedures (including critical care time) ? ?Medications Ordered in UC ?Medications  ?dexamethasone (DECADRON) injection 10 mg (10  mg Intramuscular Given 07/25/21 1938)  ? ? ?Initial Impression / Assessment and Plan / UC Course  ?  I have reviewed the triage vital signs and the nursing notes. ? ?Pertinent labs & imaging results that were available during my care of the patient were reviewed by me and considered in my medical decision making (see chart for details). ? ?  ? ?Treat with IM Decadron as over-the-counter pain relievers have not been significantly improving symptoms, add Flexeril, continue heat, stretches, massage and other over-the-counter supportive remedies.  Return for worsening symptoms. ? ?Final Clinical Impressions(s) / UC Diagnoses  ? ?Final diagnoses:  ?Mid back pain on right side  ? ?Discharge Instructions   ?None ?  ? ?ED Prescriptions   ? ? Medication Sig Dispense Auth. Provider  ? cyclobenzaprine (FLEXERIL) 10 MG tablet Take 1 tablet (10 mg total) by mouth 3 (three) times daily as needed for muscle spasms. Do not drink alcohol or drive while taking this medication. May cause drowsiness 15 tablet Particia Nearing, [place]  ? ?  ? ?PDMP not reviewed this encounter. ?  ?Particia Nearing, PA-C ?07/25/21 1946 ? ?

## 2021-07-25 NOTE — ED Triage Notes (Signed)
Pt states right sided mid back pain for the past 10 days states he has tried heat,and OTC meds with no relief. ?

## 2021-11-29 ENCOUNTER — Encounter: Payer: Self-pay | Admitting: Emergency Medicine

## 2021-11-29 ENCOUNTER — Ambulatory Visit
Admission: EM | Admit: 2021-11-29 | Discharge: 2021-11-29 | Disposition: A | Discharge status: Home / Self Care | Payer: PRIVATE HEALTH INSURANCE | Attending: Nurse Practitioner | Admitting: Nurse Practitioner

## 2021-11-29 ENCOUNTER — Ambulatory Visit (INDEPENDENT_AMBULATORY_CARE_PROVIDER_SITE_OTHER): Payer: PRIVATE HEALTH INSURANCE

## 2021-11-29 ENCOUNTER — Other Ambulatory Visit: Payer: Self-pay

## 2021-11-29 DIAGNOSIS — G8929 Other chronic pain: Secondary | ICD-10-CM | POA: Diagnosis not present

## 2021-11-29 DIAGNOSIS — M1712 Unilateral primary osteoarthritis, left knee: Secondary | ICD-10-CM | POA: Diagnosis not present

## 2021-11-29 DIAGNOSIS — M25562 Pain in left knee: Secondary | ICD-10-CM | POA: Diagnosis not present

## 2021-11-29 MED ORDER — MELOXICAM 7.5 MG PO TABS: 7.5000 mg | ORAL_TABLET | Freq: Every day | ORAL | 0 refills | Status: DC

## 2021-11-29 NOTE — ED Provider Notes (Signed)
[facility]    CSN: [id number] Arrival date & time: 11/29/21  1646      History   Chief Complaint Chief Complaint  Patient presents with   Knee Pain    HPI Jose Short. is a 51 y.o. male.   The history is provided by the patient.   Patient presents for complaints of left knee pain that have worsened over the past 2 days.  Patient has a history of unilateral primary osteoarthritis of the left knee.  Patient states that he has not seen orthopedics since 2021, because he has not had any issues.  He states that he was scheduled to have surgery, but his A1c was too high and the surgery was postponed.  He has not seen orthopedics since that time.  He states that 2 days ago he is unsure if he missed stepped or if his knee gave out but somehow "tweaked" the left knee.  He states since that time he has had pain and swelling of the left knee.  He states he was at 1 time unable to bear weight, but since that time is able to walk on it.  Patient states he has been taking ibuprofen for his symptoms.  He denies numbness, tingling, radiation of pain, or left ankle pain. Past Medical History:  Diagnosis Date   Arthritis    Diabetes mellitus    Hyperlipidemia    Hypertension     Patient Active Problem List   Diagnosis Date Noted   Unilateral primary osteoarthritis, left knee 05/29/2018   Nausea vomiting and diarrhea 08/03/2013   Abnormal CT of the abdomen 08/03/2013   DM type 2 (diabetes mellitus, type 2) ([place]) 08/03/2013   HTN (hypertension), benign 08/03/2013   Hyperlipidemia 08/03/2013   JOINT EFFUSION, RIGHT KNEE 02/18/2008   TEAR MEDIAL MENISCUS 02/18/2008    Past Surgical History:  Procedure Laterality Date   HERNIA REPAIR         Home Medications    Prior to Admission medications   Medication Sig Start Date End Date Taking? Authorizing Provider  meloxicam (MOBIC) 7.5 MG tablet Take 1 tablet (7.5 mg total) by mouth daily. 11/29/21  Yes Ismail Graziani-Warren,  Alda Lea, NP  acetaminophen (TYLENOL) 500 MG tablet Take 1,000 mg by mouth every 6 (six) hours as needed for moderate pain or headache.     [provider]  aspirin EC 325 MG tablet Take 325 mg by mouth daily.    [provider]  atorvastatin (LIPITOR) 20 MG tablet Take 20 mg by mouth daily.    [provider]  cyclobenzaprine (FLEXERIL) 10 MG tablet Take 1 tablet (10 mg total) by mouth 3 (three) times daily as needed for muscle spasms. Do not drink alcohol or drive while taking this medication. May cause drowsiness 07/25/21   Volney American, PA-C  Ertugliflozin-SITagliptin (STEGLUJAN) 15-100 MG TABS Take 1 tablet by mouth in the morning.    [provider]  glipiZIDE (GLUCOTROL XL) 10 MG 24 hr tablet Take 20 mg by mouth daily with breakfast.  Patient not taking: Reported on 11/29/2021    [provider]  ibuprofen (ADVIL,MOTRIN) 200 MG tablet Take 200 mg by mouth every 6 (six) hours as needed for mild pain or moderate pain.     [provider]  Insulin Glargine (BASAGLAR KWIKPEN [place]) Inject 20 Units into the skin at bedtime.    [provider]  lisinopril (PRINIVIL,ZESTRIL) 10 MG tablet Take 10 mg by mouth daily.  [provider]  metFORMIN (GLUCOPHAGE) 1000 MG tablet Take 1 tablet (1,000 mg total) by mouth 2 (two) times daily. Patient not taking: Reported on 07/16/2019 10/03/15   Burgess Amor, PA-C  metFORMIN (GLUCOPHAGE) 500 MG tablet Take 1,000 mg by mouth 2 (two) times daily. 06/24/19   [provider]  Multiple Vitamin tablet Take 1 tablet by mouth daily.    [provider]  Potassium 99 MG TABS Take 1 tablet by mouth daily.    [provider]    Family History Family History  Problem Relation Age of Onset   Diabetes Mother    Hypertension Mother    Diabetes Father    Hypertension Father    Stroke Other     Social History Social History   Tobacco Use   Smoking status: Former     Years: 3.00    Types: Cigarettes    Quit date: 12/10/1988    Years since quitting: 32.9   Smokeless tobacco: Current    Types: Snuff  Vaping Use   Vaping Use: Never used  Substance Use Topics   Alcohol use: No   Drug use: No     Allergies   Penicillins   Review of Systems Review of Systems Per HPI  Physical Exam Triage Vital Signs ED Triage Vitals  Enc Vitals Group     BP 11/29/21 1712 (!) 162/105     Pulse Rate 11/29/21 1712 90     Resp 11/29/21 1712 20     Temp 11/29/21 1712 97.8 F (36.6 C)     Temp Source 11/29/21 1712 Oral     SpO2 11/29/21 1712 96 %     Weight --      Height --      Head Circumference --      Peak Flow --      Pain Score 11/29/21 1701 6     Pain Loc --      Pain Edu? --      Excl. in GC? --    No data found.  Updated Vital Signs BP (!) 162/105 (BP Location: Right Arm) Comment: x2, two different cuffs. pt reports is on BP medication and "it fluctuates" PCP aware per pt.  Pulse 90   Temp 97.8 F (36.6 C) (Oral)   Resp 20   SpO2 96%   Visual Acuity Right Eye Distance:   Left Eye Distance:   Bilateral Distance:    Right Eye Near:   Left Eye Near:    Bilateral Near:     Physical Exam Vitals and nursing note reviewed.  Constitutional:      General: He is not in acute distress.    Appearance: Normal appearance.  HENT:     Head: Normocephalic.  Eyes:     Extraocular Movements: Extraocular movements intact.     Conjunctiva/sclera: Conjunctivae normal.     Pupils: Pupils are equal, round, and reactive to light.  Pulmonary:     Effort: Pulmonary effort is normal.  Musculoskeletal:     Left knee: Swelling present. Decreased range of motion. Tenderness present over the medial joint line, lateral joint line, MCL, LCL, ACL and patellar tendon. Normal pulse.  Skin:    General: Skin is warm and dry.  Neurological:     General: No focal deficit present.     Mental Status: He is alert and oriented to person, place, and time.   Psychiatric:        Mood and Affect: Mood normal.  Behavior: Behavior normal.      UC Treatments / Results  Labs (all labs ordered are listed, but only abnormal results are displayed) Labs Reviewed - No data to display  EKG   Radiology DG Knee Complete 4 Views Left  Result Date: 11/29/2021 CLINICAL DATA:  Chronic knee pain. EXAM: LEFT KNEE - COMPLETE 4+ VIEW COMPARISON:  Radiograph 06/24/2019 FINDINGS: Moderate medial tibiofemoral joint space narrowing. There is tricompartmental peripheral spurring prominent in the patellofemoral medial tibiofemoral compartment. No fracture. Normal alignment. Trace knee joint effusion. No erosion, bony destruction or focal bone abnormality. IMPRESSION: Tricompartmental osteoarthritis, moderate in the medial tibiofemoral compartment. Mild progression from 2021. Electronically Signed   By: Narda Rutherford M.D.   On: 11/29/2021 17:38    Procedures Procedures (including critical care time)  Medications Ordered in UC Medications - No data to display  Initial Impression / Assessment and Plan / UC Course  I have reviewed the triage vital signs and the nursing notes.  Pertinent labs & imaging results that were available during my care of the patient were reviewed by me and considered in my medical decision making (see chart for details).  Patient presents for complaints of left knee pain that was aggravated when the patient missed stepped or either the left knee became weak.  On exam, he does have swelling to the entire aspect of the left knee.  The entire plane of the left knee is also tender.  X-rays are negative for any new fracture or dislocation but do show mild progression of the osteoarthritis when compared to imaging performed in 2021. Will prescribe meloxicam for patient's osteoarthritis at this time.  A knee brace was provided for the patient to add compression and support.  Patient was advised that he will need to follow-up with orthopedics  for further evaluation if his symptoms do not improve.  Patient was advised to follow-up with his previous orthopedist, Dr. Cleophas Dunker. Final Clinical Impressions(s) / UC Diagnoses   Final diagnoses:  Osteoarthritis of left knee, unspecified osteoarthritis type     Discharge Instructions      The x-rays are negative for fracture or dislocation.  Based on your injury, you have most likely aggravated the underlying osteoarthritis of the left knee. Take medication as prescribed. RICE therapy, rest, ice, compression, and elevation.  Apply ice for 20 minutes, remove for 1 hour, then repeat as much as possible. Wear the brace to provide compression and support of the left knee.  Wear the brace when you are engaged in prolonged or strenuous activity. As discussed, please follow-up with Dr. Janett Labella office for further evaluation and to discuss surgery for the left knee. Follow-up as needed.     ED Prescriptions     Medication Sig Dispense Auth. Provider   meloxicam (MOBIC) 7.5 MG tablet Take 1 tablet (7.5 mg total) by mouth daily. 30 tablet Arantza Darrington-Warren, Sadie [place]er, NP      PDMP not reviewed this encounter.   Abran Cantor, NP 11/29/21 1746

## 2021-11-29 NOTE — ED Triage Notes (Addendum)
Pt reports left knee pain/swelling since Sunday. Pt denies any known injury but reports left knee gave out while at cookout of Saturday. Pt able to bear weight but reports history of similar but states this pain is different. Pt reports "bone on bone" need meniscus repair.  Pt reports prescription for glipizide and metformin ran out of refills and is trying to follow up with PCP.

## 2021-11-29 NOTE — Discharge Instructions (Addendum)
The x-rays are negative for fracture or dislocation.  Based on your injury, you have most likely aggravated the underlying osteoarthritis of the left knee. Take medication as prescribed. RICE therapy, rest, ice, compression, and elevation.  Apply ice for 20 minutes, remove for 1 hour, then repeat as much as possible. Wear the brace to provide compression and support of the left knee.  Wear the brace when you are engaged in prolonged or strenuous activity. As discussed, please follow-up with Dr. Luna Glasgow office for further evaluation and to discuss surgery for the left knee. Follow-up as needed.

## 2022-03-12 ENCOUNTER — Encounter: Payer: Self-pay | Admitting: *Deleted

## 2022-03-12 ENCOUNTER — Other Ambulatory Visit: Payer: Self-pay

## 2022-03-12 ENCOUNTER — Ambulatory Visit
Admission: EM | Admit: 2022-03-12 | Discharge: 2022-03-12 | Disposition: A | Discharge status: Home / Self Care | Payer: PRIVATE HEALTH INSURANCE | Attending: Family Medicine | Admitting: Family Medicine

## 2022-03-12 DIAGNOSIS — J029 Acute pharyngitis, unspecified: Secondary | ICD-10-CM

## 2022-03-12 DIAGNOSIS — J3089 Other allergic rhinitis: Secondary | ICD-10-CM | POA: Insufficient documentation

## 2022-03-12 LAB — POCT RAPID STREP A (OFFICE): Rapid Strep A Screen: NEGATIVE

## 2022-03-12 MED ORDER — CETIRIZINE HCL 10 MG PO TABS: 10.0000 mg | ORAL_TABLET | Freq: Every day | ORAL | 2 refills | Status: DC

## 2022-03-12 MED ORDER — FLUTICASONE PROPIONATE 50 MCG/ACT NA SUSP: 1.0000 | Freq: Two times a day (BID) | NASAL | 2 refills | Status: DC

## 2022-03-12 NOTE — ED Provider Notes (Signed)
[facility]    CSN: [id number] Arrival date & time: 03/12/22  0807      History   Chief Complaint Chief Complaint  Patient presents with   Sore Throat    HPI Jose Minder. is a 52 y.o. male.   Presenting today with 1 week history of sore, swollen feeling throat.  Notes that he always has morning time congestion, coughing but that always resolves as the day goes on and thinks it is all sinus related.  Denies fever, chills, body aches, chest pain, shortness of breath, abdominal pain, nausea vomiting or diarrhea.  Using throat lozenges, throat sprays, DayQuil, NyQuil with minimal relief.  Not on anything for seasonal allergies.  No known sick contacts recently.   Past Medical History:  Diagnosis Date   Arthritis    Diabetes mellitus    Hyperlipidemia    Hypertension     Patient Active Problem List   Diagnosis Date Noted   Unilateral primary osteoarthritis, left knee 05/29/2018   Nausea vomiting and diarrhea 08/03/2013   Abnormal CT of the abdomen 08/03/2013   DM type 2 (diabetes mellitus, type 2) (HCC) 08/03/2013   HTN (hypertension), benign 08/03/2013   Hyperlipidemia 08/03/2013   JOINT EFFUSION, RIGHT KNEE 02/18/2008   TEAR MEDIAL MENISCUS 02/18/2008    Past Surgical History:  Procedure Laterality Date   HERNIA REPAIR         Home Medications    Prior to Admission medications   Medication Sig Start Date End Date Taking? Authorizing Provider  cetirizine (ZYRTEC ALLERGY) 10 MG tablet Take 1 tablet (10 mg total) by mouth daily. 03/12/22  Yes Particia Nearing, PA-C  fluticasone [facility]) 50 MCG/ACT nasal spray Place 1 spray into both nostrils 2 (two) times daily. 03/12/22  Yes Particia Nearing, PA-C  acetaminophen (TYLENOL) 500 MG tablet Take 1,000 mg by mouth every 6 (six) hours as needed for moderate pain or headache.     [provider]  aspirin EC 325 MG tablet Take 325 mg by mouth daily.    [provider]   atorvastatin (LIPITOR) 20 MG tablet Take 20 mg by mouth daily.    [provider]  cyclobenzaprine (FLEXERIL) 10 MG tablet Take 1 tablet (10 mg total) by mouth 3 (three) times daily as needed for muscle spasms. Do not drink alcohol or drive while taking this medication. May cause drowsiness 07/25/21   Particia Nearing, PA-C  Ertugliflozin-SITagliptin (STEGLUJAN) 15-100 MG TABS Take 1 tablet by mouth in the morning.    [provider]  glipiZIDE (GLUCOTROL XL) 10 MG 24 hr tablet Take 20 mg by mouth daily with breakfast.  Patient not taking: Reported on 11/29/2021    [provider]  ibuprofen (ADVIL,MOTRIN) 200 MG tablet Take 200 mg by mouth every 6 (six) hours as needed for mild pain or moderate pain.     [provider]  Insulin Glargine (BASAGLAR KWIKPEN [place]) Inject 20 Units into the skin at bedtime.    [provider]  lisinopril (PRINIVIL,ZESTRIL) 10 MG tablet Take 10 mg by mouth daily.    [provider]  meloxicam (MOBIC) 7.5 MG tablet Take 1 tablet (7.5 mg total) by mouth daily. 11/29/21   Leath-Warren, Sadie Haber, NP  metFORMIN (GLUCOPHAGE) 1000 MG tablet Take 1 tablet (1,000 mg total) by mouth 2 (two) times daily. Patient not taking: Reported on 07/16/2019 10/03/15   Burgess Amor, PA-C  metFORMIN (GLUCOPHAGE) 500 MG tablet Take 1,000 mg by  mouth 2 (two) times daily. 06/24/19   [provider]  Multiple Vitamin tablet Take 1 tablet by mouth daily.    [provider]  Potassium 99 MG TABS Take 1 tablet by mouth daily.    [provider]    Family History Family History  Problem Relation Age of Onset   Diabetes Mother    Hypertension Mother    Diabetes Father    Hypertension Father    Stroke Other     Social History Social History   Tobacco Use   Smoking status: Former    Years: 3.00    Types: Cigarettes    Quit date: 12/10/1988    Years since quitting: 33.2   Smokeless tobacco: Current    Types:  Snuff  Vaping Use   Vaping Use: Never used  Substance Use Topics   Alcohol use: No   Drug use: No     Allergies   Penicillins   Review of Systems Review of Systems Per HPI  Physical Exam Triage Vital Signs ED Triage Vitals  Enc Vitals Group     BP 03/12/22 0824 (!) 154/104     Pulse Rate 03/12/22 0824 82     Resp 03/12/22 0824 18     Temp 03/12/22 0824 (!) 97.5 F (36.4 C)     Temp src --      SpO2 --      Weight --      Height --      Head Circumference --      Peak Flow --      Pain Score 03/12/22 0827 8     Pain Loc --      Pain Edu? --      Excl. in [place]? --    No data found.  Updated Vital Signs BP (!) 154/104 Comment: PT did not take meds for BP today.  Pulse 82   Temp (!) 97.5 F (36.4 C)   Resp 18   Visual Acuity Right Eye Distance:   Left Eye Distance:   Bilateral Distance:    Right Eye Near:   Left Eye Near:    Bilateral Near:     Physical Exam Vitals and nursing note reviewed.  Constitutional:      Appearance: Normal appearance. He is well-developed.  HENT:     Head: Atraumatic.     Right Ear: External ear normal.     Left Ear: External ear normal.     Nose:     Comments: Bilateral nasal turbinates erythematous and edematous    Mouth/Throat:     Mouth: Mucous membranes are moist.     Pharynx: Posterior oropharyngeal erythema present. No oropharyngeal exudate.     Comments: Mild tonsillar erythema, edema bilaterally, posterior pharyngeal erythema Eyes:     Extraocular Movements: Extraocular movements intact.     Conjunctiva/sclera: Conjunctivae normal.     Pupils: Pupils are equal, round, and reactive to light.  Cardiovascular:     Rate and Rhythm: Normal rate and regular rhythm.     Heart sounds: Normal heart sounds.  Pulmonary:     Effort: Pulmonary effort is normal. No respiratory distress.     Breath sounds: Normal breath sounds. No wheezing or rales.  Musculoskeletal:        General: Normal range of motion.     Cervical  back: Normal range of motion and neck supple.  Lymphadenopathy:     Cervical: No cervical adenopathy.  Skin:    General: Skin  is warm and dry.  Neurological:     General: No focal deficit present.     Mental Status: He is alert and oriented to person, place, and time.  Psychiatric:        Mood and Affect: Mood normal.        Behavior: Behavior normal.        Thought Content: Thought content normal.        Judgment: Judgment normal.    UC Treatments / Results  Labs (all labs ordered are listed, but only abnormal results are displayed) Labs Reviewed  CULTURE, GROUP A STREP [facility])  POCT RAPID STREP A (OFFICE)    EKG  Radiology No results found.  Procedures Procedures (including critical care time)  Medications Ordered in UC Medications - No data to display  Initial Impression / Assessment and Plan / UC Course  I have reviewed the triage vital signs and the nursing notes.  Pertinent labs & imaging results that were available during my care of the patient were reviewed by me and considered in my medical decision making (see chart for details).     Vitals and exam very reassuring today, rapid strep negative and throat culture pending, unclear if viral pharyngitis or related to ongoing postnasal drip from uncontrolled seasonal allergies.  Will start good allergy regimen Zyrtec and Flonase and continue supportive over-the-counter medications and home care. Final Clinical Impressions(s) / UC Diagnoses   Final diagnoses:  Acute pharyngitis, unspecified etiology  Seasonal allergic rhinitis due to other allergic trigger   Discharge Instructions   None    ED Prescriptions     Medication Sig Dispense Auth. Provider   fluticasone (FLONASE) 50 MCG/ACT nasal spray Place 1 spray into both nostrils 2 (two) times daily. 16 g Volney American, [place]   cetirizine (ZYRTEC ALLERGY) 10 MG tablet Take 1 tablet (10 mg total) by mouth daily. 30 tablet Volney American, [place]       PDMP not reviewed this encounter.   Volney American, [place] 03/12/22 1136

## 2022-03-12 NOTE — ED Triage Notes (Signed)
Pt reports he has had a sore throat for 1 week with out fever or other Sx's.

## 2022-03-14 LAB — CULTURE, GROUP A STREP (THRC)

## 2022-03-15 ENCOUNTER — Telehealth (HOSPITAL_COMMUNITY): Payer: Self-pay | Admitting: Emergency Medicine

## 2022-03-15 MED ORDER — AZITHROMYCIN 250 MG PO TABS: 250.0000 mg | ORAL_TABLET | Freq: Every day | ORAL | 0 refills | Status: DC

## 2022-06-08 ENCOUNTER — Other Ambulatory Visit: Payer: Self-pay | Admitting: Family Medicine

## 2022-06-08 NOTE — Telephone Encounter (Signed)
Requested Prescriptions  Pending Prescriptions Disp Refills   cetirizine (ZYRTEC) 10 MG tablet [Pharmacy Med Name: Cetirizine HCl 10 MG Oral Tablet] 30 tablet 0    Sig: Take 1 tablet by mouth once daily     There is no refill protocol information for this order

## 2023-01-31 DIAGNOSIS — N5201 Erectile dysfunction due to arterial insufficiency: Secondary | ICD-10-CM | POA: Diagnosis not present

## 2023-05-17 ENCOUNTER — Ambulatory Visit
Admission: EM | Admit: 2023-05-17 | Discharge: 2023-05-17 | Disposition: A | Discharge status: Home / Self Care | Payer: Self-pay | Attending: Family Medicine | Admitting: Family Medicine

## 2023-05-17 ENCOUNTER — Encounter: Payer: Self-pay | Admitting: Emergency Medicine

## 2023-05-17 DIAGNOSIS — J0100 Acute maxillary sinusitis, unspecified: Secondary | ICD-10-CM | POA: Diagnosis not present

## 2023-05-17 MED ORDER — PROMETHAZINE-DM 6.25-15 MG/5ML PO SYRP: 5.0000 mL | ORAL_SOLUTION | Freq: Four times a day (QID) | ORAL | 0 refills | Status: AC | PRN

## 2023-05-17 MED ORDER — AZITHROMYCIN 250 MG PO TABS: ORAL_TABLET | ORAL | 0 refills | Status: AC

## 2023-05-17 NOTE — ED Triage Notes (Signed)
 Sinus congestion and productive cough with green sputum x 2 weeks.  Has been taking mucinex and robitussin without relief.

## 2023-05-17 NOTE — ED Provider Notes (Signed)
 [facility]    CSN: [id number] Arrival date & time: 05/17/23  1559      History   Chief Complaint No chief complaint on file.   HPI Jose Bee. is a 53 y.o. male.   Patient presenting today with about 2 weeks of progressively worsening nasal congestion, sinus pain and pressure, productive cough, headache, fatigue.  Denies fever, chills, chest pain, shortness of breath, abdominal pain, vomiting, diarrhea.  So far trying Mucinex, Robitussin with minimal relief.  No known history of chronic pulmonary disease.    Past Medical History:  Diagnosis Date   Arthritis    Diabetes mellitus    Hyperlipidemia    Hypertension     Patient Active Problem List   Diagnosis Date Noted   Unilateral primary osteoarthritis, left knee 05/29/2018   Nausea vomiting and diarrhea 08/03/2013   Abnormal CT of the abdomen 08/03/2013   DM type 2 (diabetes mellitus, type 2) (HCC) 08/03/2013   HTN (hypertension), benign 08/03/2013   Hyperlipidemia 08/03/2013   JOINT EFFUSION, RIGHT KNEE 02/18/2008   TEAR MEDIAL MENISCUS 02/18/2008    Past Surgical History:  Procedure Laterality Date   HERNIA REPAIR         Home Medications    Prior to Admission medications   Medication Sig Start Date End Date Taking? Authorizing Provider  azithromycin (ZITHROMAX) 250 MG tablet Take first 2 tablets together, then 1 every day until finished. 05/17/23  Yes Particia Nearing, PA-C  promethazine-dextromethorphan (PROMETHAZINE-DM) 6.25-15 MG/5ML syrup Take 5 mLs by mouth 4 (four) times daily as needed. 05/17/23  Yes Particia Nearing, PA-C  acetaminophen (TYLENOL) 500 MG tablet Take 1,000 mg by mouth every 6 (six) hours as needed for moderate pain or headache.     [provider]  aspirin EC 325 MG tablet Take 325 mg by mouth daily.    [provider]  Ertugliflozin-SITagliptin (STEGLUJAN) 15-100 MG TABS Take 1 tablet by mouth in the morning.    [provider]   ibuprofen (ADVIL,MOTRIN) 200 MG tablet Take 200 mg by mouth every 6 (six) hours as needed for mild pain or moderate pain.     [provider]  Insulin Glargine (BASAGLAR KWIKPEN [place]) Inject 20 Units into the skin at bedtime.    [provider]  lisinopril (PRINIVIL,ZESTRIL) 10 MG tablet Take 10 mg by mouth daily.    [provider]  metFORMIN (GLUCOPHAGE) 1000 MG tablet Take 1 tablet (1,000 mg total) by mouth 2 (two) times daily. Patient not taking: Reported on 07/16/2019 10/03/15   Burgess Amor, PA-C  metFORMIN (GLUCOPHAGE) 500 MG tablet Take 1,000 mg by mouth 2 (two) times daily. 06/24/19   [provider]  Multiple Vitamin tablet Take 1 tablet by mouth daily.    [provider]  Potassium 99 MG TABS Take 1 tablet by mouth daily.    [provider]    Family History Family History  Problem Relation Age of Onset   Diabetes Mother    Hypertension Mother    Diabetes Father    Hypertension Father    Stroke Other     Social History Social History   Tobacco Use   Smoking status: Former    Current packs/day: 0.00    Types: Cigarettes    Start date: 12/10/1985    Quit date: 12/10/1988    Years since quitting: 34.4   Smokeless tobacco: Current    Types: Snuff  Vaping Use   Vaping status:  Never Used  Substance Use Topics   Alcohol use: No   Drug use: No     Allergies   Penicillins   Review of Systems Review of Systems Per HPI  Physical Exam Triage Vital Signs ED Triage Vitals  Encounter Vitals Group     BP 05/17/23 1631 (!) 153/89     Systolic BP Percentile --      Diastolic BP Percentile --      Pulse Rate 05/17/23 1631 88     Resp 05/17/23 1631 16     Temp 05/17/23 1631 97.9 F (36.6 C)     Temp Source 05/17/23 1631 Oral     SpO2 05/17/23 1631 97 %     Weight --      Height --      Head Circumference --      Peak Flow --      Pain Score 05/17/23 1632 6     Pain Loc --      Pain Education --      Exclude from  Growth Chart --    No data found.  Updated Vital Signs BP (!) 153/89 (BP Location: Right Arm)   Pulse 88   Temp 97.9 F (36.6 C) (Oral)   Resp 16   SpO2 97%   Visual Acuity Right Eye Distance:   Left Eye Distance:   Bilateral Distance:    Right Eye Near:   Left Eye Near:    Bilateral Near:     Physical Exam Vitals and nursing note reviewed.  Constitutional:      Appearance: He is well-developed.  HENT:     Head: Atraumatic.     Right Ear: Tympanic membrane and external ear normal.     Left Ear: Tympanic membrane and external ear normal.     Nose: Congestion present.     Mouth/Throat:     Pharynx: Posterior oropharyngeal erythema present. No oropharyngeal exudate.  Eyes:     Conjunctiva/sclera: Conjunctivae normal.     Pupils: Pupils are equal, round, and reactive to light.  Cardiovascular:     Rate and Rhythm: Normal rate and regular rhythm.  Pulmonary:     Effort: Pulmonary effort is normal. No respiratory distress.     Breath sounds: No wheezing or rales.  Musculoskeletal:        General: Normal range of motion.     Cervical back: Normal range of motion and neck supple.  Lymphadenopathy:     Cervical: No cervical adenopathy.  Skin:    General: Skin is warm and dry.  Neurological:     Mental Status: He is alert and oriented to person, place, and time.  Psychiatric:        Behavior: Behavior normal.      UC Treatments / Results  Labs (all labs ordered are listed, but only abnormal results are displayed) Labs Reviewed - No data to display  EKG   Radiology No results found.  Procedures Procedures (including critical care time)  Medications Ordered in UC Medications - No data to display  Initial Impression / Assessment and Plan / UC Course  I have reviewed the triage vital signs and the nursing notes.  Pertinent labs & imaging results that were available during my care of the patient were reviewed by me and considered in my medical decision  making (see chart for details).     Vital signs reassuring today, given duration worsening course will treat with Zithromax, Phenergan DM, nasal sprays, antihistamines, sinus  rinses, supportive home care.  Return for worsening symptoms.  Final Clinical Impressions(s) / UC Diagnoses   Final diagnoses:  Acute maxillary sinusitis, recurrence not specified   Discharge Instructions   None    ED Prescriptions     Medication Sig Dispense Auth. Provider   azithromycin (ZITHROMAX) 250 MG tablet Take first 2 tablets together, then 1 every day until finished. 6 tablet Particia Nearing, [place]   promethazine-dextromethorphan (PROMETHAZINE-DM) 6.25-15 MG/5ML syrup Take 5 mLs by mouth 4 (four) times daily as needed. 100 mL Particia Nearing, [place]      PDMP not reviewed this encounter.   Particia Nearing, [place] 05/17/23 1753

## 2023-06-08 ENCOUNTER — Ambulatory Visit (INDEPENDENT_AMBULATORY_CARE_PROVIDER_SITE_OTHER)

## 2023-06-08 ENCOUNTER — Ambulatory Visit
Admission: EM | Admit: 2023-06-08 | Discharge: 2023-06-08 | Disposition: A | Discharge status: Home / Self Care | Attending: Nurse Practitioner | Admitting: Nurse Practitioner

## 2023-06-08 DIAGNOSIS — M25511 Pain in right shoulder: Secondary | ICD-10-CM | POA: Diagnosis not present

## 2023-06-08 DIAGNOSIS — M19011 Primary osteoarthritis, right shoulder: Secondary | ICD-10-CM | POA: Diagnosis not present

## 2023-06-08 DIAGNOSIS — S4991XA Unspecified injury of right shoulder and upper arm, initial encounter: Secondary | ICD-10-CM

## 2023-06-08 MED ORDER — KETOROLAC TROMETHAMINE 30 MG/ML IJ SOLN
30.0000 mg | Freq: Once | INTRAMUSCULAR | Status: AC
  Administered 2023-06-08: 30 mg via INTRAMUSCULAR

## 2023-06-08 NOTE — ED Triage Notes (Addendum)
 Pt states he has right elbow to shoulder pain x 2 weeks. Pt states he fell in a hole x 2 weeks ago and caught hisself with his right arm. Pt states he cannot lift his right arm past rest, and has to use his other arm to lift the arm.

## 2023-06-08 NOTE — Discharge Instructions (Addendum)
 X-ray of the right shoulder is negative for fracture or dislocation, but it does show degenerative changes.  Recommend that you follow-up with orthopedics for further evaluation and further imaging for ongoing symptoms.  I have placed a referral for [facility].  Please call their office on 06/10/23 to schedule an appointment. You were given an injection of Toradol 30 mg today.  Do not take any additional NSAIDs such as ibuprofen, Aleve, Motrin, or naproxen.  You may take over-the-counter Tylenol for breakthrough pain or discomfort. Recommend the use of ice or heat.  Apply ice for pain or swelling, heat for spasm or stiffness.  Apply for 20 minutes, remove for 1 hour, repeat as needed. I provided some shoulder exercises for you to begin performing at home.  Try to perform the exercises at least 2-3 times daily. Follow-up as needed.

## 2023-06-08 NOTE — ED Provider Notes (Signed)
 [facility]    CSN: [id number] Arrival date & time: 06/08/23  1453      History   Chief Complaint Chief Complaint  Patient presents with   Shoulder Pain    HPI Jose Short. is a 53 y.o. male.   The history is provided by the patient.   Patient presents for complaints of right shoulder pain.  Patient states approximately 2 weeks ago, he fell into a hole.  States that he believes he stretched his right arm out to catch himself when he fell.  He states since that time, he has had right shoulder pain and decreased range of motion.  States that he is unable to lift his right upper extremity past his chest.  He states that he has to use his other arm to raise his arm and when he does so, he has pain.  Denies numbness, tingling, radiation of pain, bruising, or swelling.  States that he has been taking over-the-counter Tylenol and ibuprofen with some relief.  Patient states over the past 24 hours, his pain has significantly worsened.  Rates pain 10/10 at present.  Patient states he is right-hand dominant.  Patient states he has purchased a shoulder brace from [employer]. Past Medical History:  Diagnosis Date   Arthritis    Diabetes mellitus    Hyperlipidemia    Hypertension     Patient Active Problem List   Diagnosis Date Noted   Unilateral primary osteoarthritis, left knee 05/29/2018   Nausea vomiting and diarrhea 08/03/2013   Abnormal CT of the abdomen 08/03/2013   DM type 2 (diabetes mellitus, type 2) (HCC) 08/03/2013   HTN (hypertension), benign 08/03/2013   Hyperlipidemia 08/03/2013   JOINT EFFUSION, RIGHT KNEE 02/18/2008   TEAR MEDIAL MENISCUS 02/18/2008    Past Surgical History:  Procedure Laterality Date   HERNIA REPAIR         Home Medications    Prior to Admission medications   Medication Sig Start Date End Date Taking? Authorizing Provider  acetaminophen (TYLENOL) 500 MG tablet Take 1,000 mg by mouth every 6 (six) hours as needed for moderate  pain or headache.     [provider]  aspirin EC 325 MG tablet Take 325 mg by mouth daily.    [provider]  azithromycin (ZITHROMAX) 250 MG tablet Take first 2 tablets together, then 1 every day until finished. 05/17/23   Particia Nearing, PA-C  Ertugliflozin-SITagliptin (STEGLUJAN) 15-100 MG TABS Take 1 tablet by mouth in the morning.    [provider]  ibuprofen (ADVIL,MOTRIN) 200 MG tablet Take 200 mg by mouth every 6 (six) hours as needed for mild pain or moderate pain.     [provider]  Insulin Glargine (BASAGLAR KWIKPEN [place]) Inject 20 Units into the skin at bedtime.    [provider]  lisinopril (PRINIVIL,ZESTRIL) 10 MG tablet Take 10 mg by mouth daily.    [provider]  metFORMIN (GLUCOPHAGE) 1000 MG tablet Take 1 tablet (1,000 mg total) by mouth 2 (two) times daily. Patient not taking: Reported on 07/16/2019 10/03/15   Burgess Amor, PA-C  metFORMIN (GLUCOPHAGE) 500 MG tablet Take 1,000 mg by mouth 2 (two) times daily. 06/24/19   [provider]  Multiple Vitamin tablet Take 1 tablet by mouth daily.    [provider]  Potassium 99 MG TABS Take 1 tablet by mouth daily.    [provider]  promethazine-dextromethorphan (PROMETHAZINE-DM) 6.25-15 MG/5ML syrup Take 5 mLs by  mouth 4 (four) times daily as needed. 05/17/23   Particia Nearing, PA-C    Family History Family History  Problem Relation Age of Onset   Diabetes Mother    Hypertension Mother    Diabetes Father    Hypertension Father    Stroke Other     Social History Social History   Tobacco Use   Smoking status: Former    Current packs/day: 0.00    Types: Cigarettes    Start date: 12/10/1985    Quit date: 12/10/1988    Years since quitting: 34.5   Smokeless tobacco: Current    Types: Snuff  Vaping Use   Vaping status: Never Used  Substance Use Topics   Alcohol use: No   Drug use: No     Allergies    Penicillins   Review of Systems Review of Systems Per HPI  Physical Exam Triage Vital Signs ED Triage Vitals [06/08/23 1506]  Encounter Vitals Group     BP (!) 168/99     Systolic BP Percentile      Diastolic BP Percentile      Pulse Rate 97     Resp 20     Temp 97.6 F (36.4 C)     Temp Source Oral     SpO2 96 %     Weight      Height      Head Circumference      Peak Flow      Pain Score 10     Pain Loc      Pain Education      Exclude from Growth Chart    No data found.  Updated Vital Signs BP (!) 168/99 (BP Location: Right Arm)   Pulse 97   Temp 97.6 F (36.4 C) (Oral)   Resp 20   SpO2 96%   Visual Acuity Right Eye Distance:   Left Eye Distance:   Bilateral Distance:    Right Eye Near:   Left Eye Near:    Bilateral Near:     Physical Exam Vitals and nursing note reviewed.  Constitutional:      General: He is not in acute distress.    Appearance: Normal appearance.  HENT:     Mouth/Throat:     Mouth: Mucous membranes are moist.  Eyes:     Extraocular Movements: Extraocular movements intact.     Pupils: Pupils are equal, round, and reactive to light.  Pulmonary:     Effort: Pulmonary effort is normal.  Musculoskeletal:     Right shoulder: Tenderness present. No swelling or deformity. Decreased range of motion. Decreased strength. Normal pulse.  Skin:    General: Skin is warm and dry.  Neurological:     General: No focal deficit present.     Mental Status: He is alert and oriented to person, place, and time.  Psychiatric:        Mood and Affect: Mood normal.        Behavior: Behavior normal.      UC Treatments / Results  Labs (all labs ordered are listed, but only abnormal results are displayed) Labs Reviewed - No data to display  EKG   Radiology DG Shoulder Right Result Date: 06/08/2023 CLINICAL DATA:  Shoulder pain after falling 2 weeks ago. EXAM: RIGHT SHOULDER - 2+ VIEW COMPARISON:  None Available. FINDINGS: The  mineralization and alignment are normal. There is no evidence of acute fracture or dislocation. Mild acromioclavicular and glenohumeral degenerative changes. No significant narrowing  of the subacromial space. Possible minimal calcific tendinitis lateral to the humeral head. IMPRESSION: No evidence of acute fracture or dislocation. Mild degenerative changes. Electronically Signed   By: Carey Bullocks M.D.   On: 06/08/2023 15:55    Procedures Procedures (including critical care time)  Medications Ordered in UC Medications  ketorolac (TORADOL) 30 MG/ML injection 30 mg (30 mg Intramuscular Given 06/08/23 1546)    Initial Impression / Assessment and Plan / UC Course  I have reviewed the triage vital signs and the nursing notes.  Pertinent labs & imaging results that were available during my care of the patient were reviewed by me and considered in my medical decision making (see chart for details).  X-ray of the right shoulder is negative for fracture or dislocation.  X-ray does show degenerative changes.  Toradol 30 mg IM administered for pain.  Due to patient's decreased range of motion, will recommend follow-up with orthopedics for further evaluation.  Will place referral for patient to follow-up with [facility] [place].  Supportive care recommendations were provided and discussed with the patient to include continuing over-the-counter analgesics, warm or cool compresses to the shoulder as needed, and gentle range of motion exercises.  Patient was in agreement with this plan of care and verbalized understanding.  All questions were answered.  Patient stable for discharge.   Final Clinical Impressions(s) / UC Diagnoses   Final diagnoses:  Right shoulder pain, unspecified chronicity  Injury of right shoulder, initial encounter   Discharge Instructions   None    ED Prescriptions   None    PDMP not reviewed this encounter.   Abran Cantor, NP 06/08/23 [phone]

## 2023-06-17 DIAGNOSIS — M25541 Pain in joints of right hand: Secondary | ICD-10-CM | POA: Diagnosis not present

## 2023-06-17 DIAGNOSIS — E7801 Familial hypercholesterolemia: Secondary | ICD-10-CM | POA: Diagnosis not present

## 2023-06-17 DIAGNOSIS — Z79899 Other long term (current) drug therapy: Secondary | ICD-10-CM | POA: Diagnosis not present

## 2023-06-17 DIAGNOSIS — K219 Gastro-esophageal reflux disease without esophagitis: Secondary | ICD-10-CM | POA: Diagnosis not present

## 2023-06-17 DIAGNOSIS — Z794 Long term (current) use of insulin: Secondary | ICD-10-CM | POA: Diagnosis not present

## 2023-06-17 DIAGNOSIS — E1169 Type 2 diabetes mellitus with other specified complication: Secondary | ICD-10-CM | POA: Diagnosis not present

## 2023-06-18 ENCOUNTER — Ambulatory Visit
Admission: RE | Admit: 2023-06-18 | Discharge: 2023-06-18 | Disposition: A | Discharge status: Home / Self Care | Source: Ambulatory Visit | Attending: Family Medicine | Admitting: Family Medicine

## 2023-06-18 ENCOUNTER — Other Ambulatory Visit: Payer: Self-pay | Admitting: Family Medicine

## 2023-06-18 DIAGNOSIS — M79642 Pain in left hand: Secondary | ICD-10-CM | POA: Diagnosis not present

## 2023-06-18 DIAGNOSIS — M25541 Pain in joints of right hand: Secondary | ICD-10-CM

## 2023-06-18 DIAGNOSIS — M25542 Pain in joints of left hand: Secondary | ICD-10-CM

## 2023-06-18 DIAGNOSIS — M19042 Primary osteoarthritis, left hand: Secondary | ICD-10-CM | POA: Diagnosis not present

## 2023-06-18 DIAGNOSIS — M79641 Pain in right hand: Secondary | ICD-10-CM | POA: Diagnosis not present

## 2023-06-18 DIAGNOSIS — M19041 Primary osteoarthritis, right hand: Secondary | ICD-10-CM | POA: Diagnosis not present

## 2023-06-24 ENCOUNTER — Ambulatory Visit: Admitting: Orthopedic Surgery

## 2023-07-10 DIAGNOSIS — E7801 Familial hypercholesterolemia: Secondary | ICD-10-CM | POA: Diagnosis not present

## 2023-07-10 DIAGNOSIS — E1169 Type 2 diabetes mellitus with other specified complication: Secondary | ICD-10-CM | POA: Diagnosis not present

## 2023-10-10 DIAGNOSIS — E7801 Familial hypercholesterolemia: Secondary | ICD-10-CM | POA: Diagnosis not present

## 2023-10-10 DIAGNOSIS — E1165 Type 2 diabetes mellitus with hyperglycemia: Secondary | ICD-10-CM | POA: Diagnosis not present

## 2023-10-10 DIAGNOSIS — Z79899 Other long term (current) drug therapy: Secondary | ICD-10-CM | POA: Diagnosis not present
